# Patient Record
Sex: Male | Born: 1945 | Race: Asian | Hispanic: No | Marital: Married | Smoking: Former smoker
Health system: Southern US, Community
[De-identification: ages and names within clinical notes are randomized; demographics above are authoritative.]

## PROBLEM LIST (undated history)

## (undated) DIAGNOSIS — M069 Rheumatoid arthritis, unspecified: Secondary | ICD-10-CM

## (undated) DIAGNOSIS — I219 Acute myocardial infarction, unspecified: Secondary | ICD-10-CM

## (undated) DIAGNOSIS — H9190 Unspecified hearing loss, unspecified ear: Secondary | ICD-10-CM

## (undated) DIAGNOSIS — M5136 Other intervertebral disc degeneration, lumbar region: Secondary | ICD-10-CM

## (undated) DIAGNOSIS — M1990 Unspecified osteoarthritis, unspecified site: Secondary | ICD-10-CM

## (undated) DIAGNOSIS — J40 Bronchitis, not specified as acute or chronic: Secondary | ICD-10-CM

## (undated) DIAGNOSIS — S62524A Nondisplaced fracture of distal phalanx of right thumb, initial encounter for closed fracture: Secondary | ICD-10-CM

## (undated) DIAGNOSIS — M51369 Other intervertebral disc degeneration, lumbar region without mention of lumbar back pain or lower extremity pain: Secondary | ICD-10-CM

## (undated) DIAGNOSIS — S3992XA Unspecified injury of lower back, initial encounter: Secondary | ICD-10-CM

## (undated) DIAGNOSIS — I10 Essential (primary) hypertension: Secondary | ICD-10-CM

## (undated) DIAGNOSIS — K219 Gastro-esophageal reflux disease without esophagitis: Secondary | ICD-10-CM

## (undated) DIAGNOSIS — I2510 Atherosclerotic heart disease of native coronary artery without angina pectoris: Secondary | ICD-10-CM

## (undated) DIAGNOSIS — E7800 Pure hypercholesterolemia, unspecified: Secondary | ICD-10-CM

## (undated) DIAGNOSIS — T82898A Other specified complication of vascular prosthetic devices, implants and grafts, initial encounter: Secondary | ICD-10-CM

## (undated) DIAGNOSIS — E119 Type 2 diabetes mellitus without complications: Secondary | ICD-10-CM

## (undated) DIAGNOSIS — N400 Enlarged prostate without lower urinary tract symptoms: Secondary | ICD-10-CM

## (undated) HISTORY — PX: CARDIAC CATHETERIZATION: SHX172

## (undated) HISTORY — PX: COLON SURGERY: SHX602

---

## 2002-09-08 DIAGNOSIS — T82898A Other specified complication of vascular prosthetic devices, implants and grafts, initial encounter: Secondary | ICD-10-CM

## 2002-09-08 HISTORY — DX: Other specified complication of vascular prosthetic devices, implants and grafts, initial encounter: T82.898A

## 2009-06-08 ENCOUNTER — Ambulatory Visit: Payer: Self-pay | Admitting: Family Medicine

## 2011-12-04 ENCOUNTER — Ambulatory Visit: Payer: Self-pay | Admitting: Gastroenterology

## 2012-01-15 ENCOUNTER — Ambulatory Visit: Payer: Self-pay | Admitting: Ophthalmology

## 2012-01-15 LAB — CREATININE, SERUM
Creatinine: 1.05 mg/dL (ref 0.60–1.30)
EGFR (Non-African Amer.): >60

## 2014-11-24 DIAGNOSIS — S62524A Nondisplaced fracture of distal phalanx of right thumb, initial encounter for closed fracture: Secondary | ICD-10-CM | POA: Insufficient documentation

## 2015-01-08 DIAGNOSIS — M19041 Primary osteoarthritis, right hand: Secondary | ICD-10-CM | POA: Insufficient documentation

## 2015-02-21 DIAGNOSIS — E785 Hyperlipidemia, unspecified: Secondary | ICD-10-CM | POA: Insufficient documentation

## 2015-10-02 NOTE — Discharge Instructions (Signed)

## 2015-10-03 ENCOUNTER — Ambulatory Visit: Payer: Medicare Other | Admitting: Anesthesiology

## 2015-10-03 ENCOUNTER — Encounter
Admission: RE | Disposition: A | Discharge status: Home / Self Care | Payer: Self-pay | Source: Ambulatory Visit | Attending: Ophthalmology

## 2015-10-03 ENCOUNTER — Ambulatory Visit
Admission: RE | Admit: 2015-10-03 | Discharge: 2015-10-03 | Disposition: A | Discharge status: Home / Self Care | Payer: Medicare Other | Source: Ambulatory Visit | Attending: Ophthalmology | Admitting: Ophthalmology

## 2015-10-03 ENCOUNTER — Encounter: Payer: Self-pay | Admitting: *Deleted

## 2015-10-03 DIAGNOSIS — K219 Gastro-esophageal reflux disease without esophagitis: Secondary | ICD-10-CM | POA: Diagnosis not present

## 2015-10-03 DIAGNOSIS — I10 Essential (primary) hypertension: Secondary | ICD-10-CM | POA: Diagnosis not present

## 2015-10-03 DIAGNOSIS — Z87891 Personal history of nicotine dependence: Secondary | ICD-10-CM | POA: Diagnosis not present

## 2015-10-03 DIAGNOSIS — H2512 Age-related nuclear cataract, left eye: Secondary | ICD-10-CM | POA: Insufficient documentation

## 2015-10-03 HISTORY — DX: Other specified complication of vascular prosthetic devices, implants and grafts, initial encounter: T82.898A

## 2015-10-03 HISTORY — PX: CATARACT EXTRACTION W/PHACO: SHX586

## 2015-10-03 HISTORY — DX: Rheumatoid arthritis, unspecified: M06.9

## 2015-10-03 HISTORY — DX: Unspecified osteoarthritis, unspecified site: M19.90

## 2015-10-03 HISTORY — DX: Gastro-esophageal reflux disease without esophagitis: K21.9

## 2015-10-03 HISTORY — DX: Bronchitis, not specified as acute or chronic: J40

## 2015-10-03 HISTORY — DX: Unspecified hearing loss, unspecified ear: H91.90

## 2015-10-03 HISTORY — DX: Pure hypercholesterolemia, unspecified: E78.00

## 2015-10-03 HISTORY — DX: Essential (primary) hypertension: I10

## 2015-10-03 SURGERY — PHACOEMULSIFICATION, CATARACT, WITH IOL INSERTION
Anesthesia: General | Laterality: Left | Wound class: Clean

## 2015-10-03 MED ORDER — FENTANYL CITRATE (PF) 100 MCG/2ML IJ SOLN
25.0000 ug | INTRAMUSCULAR | Status: DC | PRN
  Administered 2015-10-03: 100 ug via INTRAVENOUS

## 2015-10-03 MED ORDER — CEFUROXIME OPHTHALMIC INJECTION 1 MG/0.1 ML
INJECTION | OPHTHALMIC | Status: DC | PRN
  Administered 2015-10-03: 0.1 mL via INTRACAMERAL

## 2015-10-03 MED ORDER — OXYCODONE HCL 5 MG PO TABS: 5.0000 mg | ORAL_TABLET | Freq: Once | ORAL | Status: DC | PRN

## 2015-10-03 MED ORDER — TIMOLOL MALEATE 0.5 % OP SOLN
OPHTHALMIC | Status: DC | PRN
  Administered 2015-10-03: 1 [drp] via OPHTHALMIC

## 2015-10-03 MED ORDER — ACETAMINOPHEN 160 MG/5ML PO SOLN: 325.0000 mg | ORAL | Status: DC | PRN

## 2015-10-03 MED ORDER — LACTATED RINGERS IV SOLN: 500.0000 mL | INTRAVENOUS | Status: DC

## 2015-10-03 MED ORDER — ACETAMINOPHEN 325 MG PO TABS: 325.0000 mg | ORAL_TABLET | ORAL | Status: DC | PRN

## 2015-10-03 MED ORDER — MIDAZOLAM HCL 2 MG/2ML IJ SOLN
INTRAMUSCULAR | Status: DC | PRN
  Administered 2015-10-03: 2 mg via INTRAVENOUS

## 2015-10-03 MED ORDER — TETRACAINE HCL 0.5 % OP SOLN
1.0000 [drp] | OPHTHALMIC | Status: DC | PRN
  Administered 2015-10-03: 1 [drp] via OPHTHALMIC

## 2015-10-03 MED ORDER — EPINEPHRINE HCL 1 MG/ML IJ SOLN
INTRAOCULAR | Status: DC | PRN
  Administered 2015-10-03: 70 mL via OPHTHALMIC

## 2015-10-03 MED ORDER — BRIMONIDINE TARTRATE 0.2 % OP SOLN
OPHTHALMIC | Status: DC | PRN
Start: 2015-10-03 — End: 2015-10-03
  Administered 2015-10-03: 1 [drp] via OPHTHALMIC

## 2015-10-03 MED ORDER — OXYCODONE HCL 5 MG/5ML PO SOLN: 5.0000 mg | Freq: Once | ORAL | Status: DC | PRN

## 2015-10-03 MED ORDER — DEXAMETHASONE SODIUM PHOSPHATE 4 MG/ML IJ SOLN: 8.0000 mg | Freq: Once | INTRAMUSCULAR | Status: DC | PRN

## 2015-10-03 MED ORDER — ARMC OPHTHALMIC DILATING GEL
1.0000 "application " | OPHTHALMIC | Status: DC | PRN
  Administered 2015-10-03: 1 via OPHTHALMIC

## 2015-10-03 MED ORDER — NA HYALUR & NA CHOND-NA HYALUR 0.4-0.35 ML IO KIT
PACK | INTRAOCULAR | Status: DC | PRN
  Administered 2015-10-03: 1 mL via INTRAOCULAR

## 2015-10-03 MED ORDER — POVIDONE-IODINE 5 % OP SOLN
1.0000 "application " | OPHTHALMIC | Status: DC | PRN
  Administered 2015-10-03: 1 via OPHTHALMIC

## 2015-10-03 SURGICAL SUPPLY — 27 items
CANNULA ANT/CHMB 27GA (MISCELLANEOUS) ×3 IMPLANT
CARTRIDGE ABBOTT (MISCELLANEOUS) ×3 IMPLANT
GLOVE SURG LX 7.5 STRW (GLOVE) ×2
GLOVE SURG LX STRL 7.5 STRW (GLOVE) ×1 IMPLANT
GLOVE SURG TRIUMPH 8.0 PF LTX (GLOVE) ×3 IMPLANT
GOWN STRL REUS W/ TWL LRG LVL3 (GOWN DISPOSABLE) ×2 IMPLANT
GOWN STRL REUS W/TWL LRG LVL3 (GOWN DISPOSABLE) ×4
LENS IOL TECNIS 17.0 (Intraocular Lens) ×3 IMPLANT
LENS IOL TECNIS MONO 1P 17.0 (Intraocular Lens) ×1 IMPLANT
MARKER SKIN SURG W/RULER VIO (MISCELLANEOUS) ×3 IMPLANT
NDL RETROBULBAR .5 NSTRL (NEEDLE) IMPLANT
NEEDLE FILTER BLUNT 18X 1/2SAF (NEEDLE) ×2
NEEDLE FILTER BLUNT 18X1 1/2 (NEEDLE) ×1 IMPLANT
PACK CATARACT BRASINGTON (MISCELLANEOUS) ×3 IMPLANT
PACK EYE AFTER SURG (MISCELLANEOUS) ×3 IMPLANT
PACK OPTHALMIC (MISCELLANEOUS) ×3 IMPLANT
RING MALYGIN 7.0 (MISCELLANEOUS) IMPLANT
SUT ETHILON 10-0 CS-B-6CS-B-6 (SUTURE)
SUT VICRYL  9 0 (SUTURE)
SUT VICRYL 9 0 (SUTURE) IMPLANT
SUTURE EHLN 10-0 CS-B-6CS-B-6 (SUTURE) IMPLANT
SYR 3ML LL SCALE MARK (SYRINGE) ×3 IMPLANT
SYR 5ML LL (SYRINGE) IMPLANT
SYR TB 1ML LUER SLIP (SYRINGE) ×3 IMPLANT
WATER STERILE IRR 250ML POUR (IV SOLUTION) ×3 IMPLANT
WATER STERILE IRR 500ML POUR (IV SOLUTION) IMPLANT
WIPE NON LINTING 3.25X3.25 (MISCELLANEOUS) ×3 IMPLANT

## 2015-10-03 NOTE — H&P (Signed)
  The History and Physical notes are on paper, have been signed, and are to be scanned. The patient remains stable and unchanged from the H&P.   Previous H&P reviewed, patient examined, and there are no changes.  Jacob Knox 10/03/2015 8:13 AM

## 2015-10-03 NOTE — Anesthesia Preprocedure Evaluation (Signed)
Anesthesia Evaluation  Patient identified by MRN, date of birth, ID band Patient awake    Reviewed: Allergy & Precautions, H&P , NPO status , Patient's Chart, lab work & pertinent test results, reviewed documented beta blocker date and time   Airway Mallampati: II  TM Distance: >3 FB Neck ROM: full    Dental no notable dental hx.    Pulmonary former smoker,    Pulmonary exam normal breath sounds clear to auscultation       Cardiovascular Exercise Tolerance: Good hypertension, On Medications  Rhythm:regular Rate:Normal     Neuro/Psych negative neurological ROS  negative psych ROS   GI/Hepatic Neg liver ROS, GERD  Medicated,  Endo/Other  negative endocrine ROS  Renal/GU negative Renal ROS  negative genitourinary   Musculoskeletal   Abdominal   Peds  Hematology   Anesthesia Other Findings   Reproductive/Obstetrics negative OB ROS                             Anesthesia Physical Anesthesia Plan  ASA: II  Anesthesia Plan: General   Post-op Pain Management:    Induction:   Airway Management Planned:   Additional Equipment:   Intra-op Plan:   Post-operative Plan:   Informed Consent: I have reviewed the patients History and Physical, chart, labs and discussed the procedure including the risks, benefits and alternatives for the proposed anesthesia with the patient or authorized representative who has indicated his/her understanding and acceptance.   Dental Advisory Given  Plan Discussed with: CRNA  Anesthesia Plan Comments:         Anesthesia Quick Evaluation

## 2015-10-03 NOTE — Anesthesia Procedure Notes (Signed)
Procedure Name: MAC Performed by: Lazette Estala Pre-anesthesia Checklist: Patient identified, Emergency Drugs available, Suction available, Timeout performed and Patient being monitored Patient Re-evaluated:Patient Re-evaluated prior to inductionOxygen Delivery Method: Nasal cannula Placement Confirmation: positive ETCO2       

## 2015-10-03 NOTE — Anesthesia Postprocedure Evaluation (Signed)
Anesthesia Post Note  Patient: Jacob Knox  Procedure(s) Performed: Procedure(s) (LRB): CATARACT EXTRACTION PHACO AND INTRAOCULAR LENS PLACEMENT (IOC) (Left)  Patient location during evaluation: PACU Anesthesia Type: MAC Level of consciousness: awake and alert Pain management: pain level controlled Vital Signs Assessment: post-procedure vital signs reviewed and stable Respiratory status: spontaneous breathing, nonlabored ventilation and respiratory function stable Cardiovascular status: blood pressure returned to baseline and stable Postop Assessment: no signs of nausea or vomiting Anesthetic complications: no    DANIEL D KOVACS

## 2015-10-03 NOTE — Op Note (Signed)
OPERATIVE NOTE  Jacob Knox [id number] 10/03/2015   PREOPERATIVE DIAGNOSIS:  Nuclear sclerotic cataract left eye. H25.12   POSTOPERATIVE DIAGNOSIS:    Nuclear sclerotic cataract left eye.     PROCEDURE:  Phacoemusification with posterior chamber intraocular lens placement of the left eye   LENS:   Implant Name Type Inv. Item Serial No. Manufacturer Lot No. LRB No. Used  LENS IMPL INTRAOC ZCB00 17.0 - [id number] Intraocular Lens LENS IMPL INTRAOC ZCB00 17.0 [id number] AMO   Left 1        ULTRASOUND TIME: 13  % of 1 minutes 4 seconds, CDE 8.3  SURGEON:  Deirdre Evener, MD   ANESTHESIA:  Topical with tetracaine drops and 2% Xylocaine jelly.   COMPLICATIONS:  None.   DESCRIPTION OF PROCEDURE:  The patient was identified in the holding room and transported to the operating room and placed in the supine position under the operating microscope.  The left eye was identified as the operative eye and it was prepped and draped in the usual sterile ophthalmic fashion.   A 1 millimeter clear-corneal paracentesis was made at the 1:30 position.  The anterior chamber was filled with Viscoat viscoelastic.  A 2.4 millimeter keratome was used to make a near-clear corneal incision at the 10:30 position.  .  A curvilinear capsulorrhexis was made with a cystotome and capsulorrhexis forceps.  Balanced salt solution was used to hydrodissect and hydrodelineate the nucleus.   Phacoemulsification was then used in stop and chop fashion to remove the lens nucleus and epinucleus.  The remaining cortex was then removed using the irrigation and aspiration handpiece. Provisc was then placed into the capsular bag to distend it for lens placement.  A lens was then injected into the capsular bag.  The remaining viscoelastic was aspirated.   Wounds were hydrated with balanced salt solution.  The anterior chamber was inflated to a physiologic pressure with balanced salt solution.  No wound leaks were noted.  Cefuroxime 0.1 ml of a /ml solution was injected into the anterior chamber for a dose of 1 mg of intracameral antibiotic at the completion of the case.   Timolol and Brimonidine drops were applied to the eye.  The patient was taken to the recovery room in stable condition without complications of anesthesia or surgery.  Trellis Guirguis 10/03/2015, 9:53 AM

## 2015-10-03 NOTE — Transfer of Care (Signed)
Immediate Anesthesia Transfer of Care Note  Patient: Jacob Knox  Procedure(s) Performed: Procedure(s): CATARACT EXTRACTION PHACO AND INTRAOCULAR LENS PLACEMENT (IOC) (Left)  Patient Location: PACU  Anesthesia Type: General  Level of Consciousness: awake, alert  and patient cooperative  Airway and Oxygen Therapy: Patient Spontanous Breathing and Patient connected to supplemental oxygen  Post-op Assessment: Post-op Vital signs reviewed, Patient's Cardiovascular Status Stable, Respiratory Function Stable, Patent Airway and No signs of Nausea or vomiting  Post-op Vital Signs: Reviewed and stable  Complications: No apparent anesthesia complications

## 2015-10-04 ENCOUNTER — Encounter: Payer: Self-pay | Admitting: Ophthalmology

## 2016-03-18 ENCOUNTER — Other Ambulatory Visit: Payer: Self-pay | Admitting: Family Medicine

## 2016-04-22 ENCOUNTER — Telehealth: Payer: Self-pay | Admitting: *Deleted

## 2016-04-22 NOTE — Telephone Encounter (Signed)
Received referral for low dose lung cancer screening CT scan. Voicemail left at phone number listed in EMR for patient to call me back to facilitate scheduling scan.  

## 2016-09-29 DIAGNOSIS — E119 Type 2 diabetes mellitus without complications: Secondary | ICD-10-CM | POA: Insufficient documentation

## 2019-06-09 ENCOUNTER — Other Ambulatory Visit: Payer: Self-pay

## 2019-06-09 ENCOUNTER — Encounter: Payer: Self-pay | Admitting: *Deleted

## 2019-06-10 ENCOUNTER — Other Ambulatory Visit
Admission: RE | Admit: 2019-06-10 | Discharge: 2019-06-10 | Disposition: A | Discharge status: Home / Self Care | Payer: Medicare Other | Source: Ambulatory Visit | Attending: Ophthalmology | Admitting: Ophthalmology

## 2019-06-10 DIAGNOSIS — Z01812 Encounter for preprocedural laboratory examination: Secondary | ICD-10-CM | POA: Diagnosis not present

## 2019-06-10 DIAGNOSIS — Z20828 Contact with and (suspected) exposure to other viral communicable diseases: Secondary | ICD-10-CM | POA: Insufficient documentation

## 2019-06-11 LAB — SARS CORONAVIRUS 2 (TAT 6-24 HRS): SARS Coronavirus 2: NEGATIVE

## 2019-06-13 NOTE — Discharge Instructions (Signed)

## 2019-06-28 ENCOUNTER — Encounter: Payer: Self-pay | Admitting: *Deleted

## 2019-06-28 ENCOUNTER — Other Ambulatory Visit: Payer: Self-pay

## 2019-07-01 ENCOUNTER — Other Ambulatory Visit
Admission: RE | Admit: 2019-07-01 | Discharge: 2019-07-01 | Disposition: A | Discharge status: Home / Self Care | Payer: Medicare Other | Source: Ambulatory Visit | Attending: Ophthalmology | Admitting: Ophthalmology

## 2019-07-01 DIAGNOSIS — Z20828 Contact with and (suspected) exposure to other viral communicable diseases: Secondary | ICD-10-CM | POA: Diagnosis not present

## 2019-07-01 DIAGNOSIS — Z01812 Encounter for preprocedural laboratory examination: Secondary | ICD-10-CM | POA: Diagnosis present

## 2019-07-01 LAB — SARS CORONAVIRUS 2 (TAT 6-24 HRS): SARS Coronavirus 2: NEGATIVE

## 2019-07-06 ENCOUNTER — Ambulatory Visit: Payer: Medicare Other | Admitting: Anesthesiology

## 2019-07-06 ENCOUNTER — Encounter
Admission: RE | Disposition: A | Discharge status: Home / Self Care | Payer: Self-pay | Source: Home / Self Care | Attending: Ophthalmology

## 2019-07-06 ENCOUNTER — Other Ambulatory Visit: Payer: Self-pay

## 2019-07-06 ENCOUNTER — Ambulatory Visit
Admission: RE | Admit: 2019-07-06 | Discharge: 2019-07-06 | Disposition: A | Discharge status: Home / Self Care | Payer: Medicare Other | Attending: Ophthalmology | Admitting: Ophthalmology

## 2019-07-06 DIAGNOSIS — H2511 Age-related nuclear cataract, right eye: Secondary | ICD-10-CM | POA: Diagnosis not present

## 2019-07-06 DIAGNOSIS — Z7982 Long term (current) use of aspirin: Secondary | ICD-10-CM | POA: Diagnosis not present

## 2019-07-06 DIAGNOSIS — Z79899 Other long term (current) drug therapy: Secondary | ICD-10-CM | POA: Diagnosis not present

## 2019-07-06 DIAGNOSIS — E1136 Type 2 diabetes mellitus with diabetic cataract: Secondary | ICD-10-CM | POA: Insufficient documentation

## 2019-07-06 DIAGNOSIS — Z955 Presence of coronary angioplasty implant and graft: Secondary | ICD-10-CM | POA: Insufficient documentation

## 2019-07-06 DIAGNOSIS — H9190 Unspecified hearing loss, unspecified ear: Secondary | ICD-10-CM | POA: Insufficient documentation

## 2019-07-06 DIAGNOSIS — Z87891 Personal history of nicotine dependence: Secondary | ICD-10-CM | POA: Diagnosis not present

## 2019-07-06 DIAGNOSIS — I10 Essential (primary) hypertension: Secondary | ICD-10-CM | POA: Insufficient documentation

## 2019-07-06 DIAGNOSIS — E7800 Pure hypercholesterolemia, unspecified: Secondary | ICD-10-CM | POA: Diagnosis not present

## 2019-07-06 HISTORY — PX: CATARACT EXTRACTION W/PHACO: SHX586

## 2019-07-06 HISTORY — DX: Type 2 diabetes mellitus without complications: E11.9

## 2019-07-06 SURGERY — PHACOEMULSIFICATION, CATARACT, WITH IOL INSERTION
Anesthesia: Monitor Anesthesia Care | Site: Eye | Laterality: Right

## 2019-07-06 MED ORDER — ONDANSETRON HCL 4 MG/2ML IJ SOLN: 4.0000 mg | Freq: Once | INTRAMUSCULAR | Status: DC | PRN

## 2019-07-06 MED ORDER — MOXIFLOXACIN HCL 0.5 % OP SOLN
1.0000 [drp] | OPHTHALMIC | Status: DC | PRN
  Administered 2019-07-06: 1 [drp] via OPHTHALMIC

## 2019-07-06 MED ORDER — MOXIFLOXACIN HCL 0.5 % OP SOLN
1.0000 [drp] | OPHTHALMIC | Status: DC | PRN
  Administered 2019-07-06 (×2): 1 [drp] via OPHTHALMIC

## 2019-07-06 MED ORDER — CEFUROXIME OPHTHALMIC INJECTION 1 MG/0.1 ML
INJECTION | OPHTHALMIC | Status: DC | PRN
  Administered 2019-07-06: 0.1 mL via INTRACAMERAL

## 2019-07-06 MED ORDER — EPINEPHRINE PF 1 MG/ML IJ SOLN
INTRAOCULAR | Status: DC | PRN
  Administered 2019-07-06: 60 mL via OPHTHALMIC

## 2019-07-06 MED ORDER — BRIMONIDINE TARTRATE-TIMOLOL 0.2-0.5 % OP SOLN
OPHTHALMIC | Status: DC | PRN
  Administered 2019-07-06: 1 [drp] via OPHTHALMIC

## 2019-07-06 MED ORDER — NA HYALUR & NA CHOND-NA HYALUR 0.4-0.35 ML IO KIT
PACK | INTRAOCULAR | Status: DC | PRN
  Administered 2019-07-06: 1 mL via INTRAOCULAR

## 2019-07-06 MED ORDER — LACTATED RINGERS IV SOLN: INTRAVENOUS | Status: DC

## 2019-07-06 MED ORDER — ARMC OPHTHALMIC DILATING DROPS
1.0000 "application " | OPHTHALMIC | Status: DC | PRN
  Administered 2019-07-06 (×3): 1 via OPHTHALMIC

## 2019-07-06 MED ORDER — LIDOCAINE HCL (PF) 2 % IJ SOLN
INTRAOCULAR | Status: DC | PRN
  Administered 2019-07-06: 1 mL

## 2019-07-06 MED ORDER — FENTANYL CITRATE (PF) 100 MCG/2ML IJ SOLN
INTRAMUSCULAR | Status: DC | PRN
  Administered 2019-07-06: 50 ug via INTRAVENOUS

## 2019-07-06 MED ORDER — TETRACAINE HCL 0.5 % OP SOLN
1.0000 [drp] | OPHTHALMIC | Status: DC | PRN
  Administered 2019-07-06 (×3): 1 [drp] via OPHTHALMIC

## 2019-07-06 MED ORDER — MIDAZOLAM HCL 2 MG/2ML IJ SOLN
INTRAMUSCULAR | Status: DC | PRN
  Administered 2019-07-06: 2 mg via INTRAVENOUS

## 2019-07-06 SURGICAL SUPPLY — 15 items
CANNULA ANT/CHMB 27GA (MISCELLANEOUS) ×3 IMPLANT
GLOVE SURG LX 7.5 STRW (GLOVE) ×2
GLOVE SURG LX STRL 7.5 STRW (GLOVE) ×1 IMPLANT
GLOVE SURG TRIUMPH 8.0 PF LTX (GLOVE) ×3 IMPLANT
GOWN STRL REUS W/ TWL LRG LVL3 (GOWN DISPOSABLE) ×2 IMPLANT
GOWN STRL REUS W/TWL LRG LVL3 (GOWN DISPOSABLE) ×4
LENS IOL TECNIS ITEC 12.5 (Intraocular Lens) ×3 IMPLANT
MARKER SKIN DUAL TIP RULER LAB (MISCELLANEOUS) ×3 IMPLANT
PACK CATARACT BRASINGTON (MISCELLANEOUS) ×3 IMPLANT
PACK EYE AFTER SURG (MISCELLANEOUS) ×3 IMPLANT
PACK OPTHALMIC (MISCELLANEOUS) ×3 IMPLANT
SYR 3ML LL SCALE MARK (SYRINGE) ×3 IMPLANT
SYR TB 1ML LUER SLIP (SYRINGE) ×3 IMPLANT
WATER STERILE IRR 500ML POUR (IV SOLUTION) ×3 IMPLANT
WIPE NON LINTING 3.25X3.25 (MISCELLANEOUS) ×3 IMPLANT

## 2019-07-06 NOTE — Op Note (Signed)
LOCATION:  [facility]   PREOPERATIVE DIAGNOSIS:    Nuclear sclerotic cataract right eye. H25.11   POSTOPERATIVE DIAGNOSIS:  Nuclear sclerotic cataract right eye.     PROCEDURE:  Phacoemusification with posterior chamber intraocular lens placement of the right eye   ULTRASOUND TIME: Procedure(s) with comments: CATARACT EXTRACTION PHACO AND INTRAOCULAR LENS PLACEMENT (IOC) RIGHT  00:45.3  19.6%  8.85 (Right) - Diabetes - diet-controlled  LENS:   Implant Name Type Inv. Item Serial No. Manufacturer Lot No. LRB No. Used Action  LENS IOL DIOP 12.5 - [id number] Intraocular Lens LENS IOL DIOP 12.5 [id number] AMO  Right 1 Implanted         SURGEON:  Wyonia Hough, MD   ANESTHESIA:  Topical with tetracaine drops and 2% Xylocaine jelly, augmented with 1% preservative-free intracameral lidocaine.    COMPLICATIONS:  None.   DESCRIPTION OF PROCEDURE:  The patient was identified in the holding room and transported to the operating room and placed in the supine position under the operating microscope.  The right eye was identified as the operative eye and it was prepped and draped in the usual sterile ophthalmic fashion.   A 1 millimeter clear-corneal paracentesis was made at the 12:00 position.  0.5 ml of preservative-free 1% lidocaine was injected into the anterior chamber. The anterior chamber was filled with Viscoat viscoelastic.  A 2.4 millimeter keratome was used to make a near-clear corneal incision at the 9:00 position.  A curvilinear capsulorrhexis was made with a cystotome and capsulorrhexis forceps.  Balanced salt solution was used to hydrodissect and hydrodelineate the nucleus.   Phacoemulsification was then used in stop and chop fashion to remove the lens nucleus and epinucleus.  The remaining cortex was then removed using the irrigation and aspiration handpiece. Provisc was then placed into the capsular bag to distend it for lens placement.  A lens was then injected  into the capsular bag.  The remaining viscoelastic was aspirated.   Wounds were hydrated with balanced salt solution.  The anterior chamber was inflated to a physiologic pressure with balanced salt solution.  No wound leaks were noted. Cefuroxime 0.1 ml of a 10mg /ml solution was injected into the anterior chamber for a dose of 1 mg of intracameral antibiotic at the completion of the case.   Timolol and Brimonidine drops were applied to the eye.  The patient was taken to the recovery room in stable condition without complications of anesthesia or surgery.   Paris Chiriboga 07/06/2019, 8:03 AM

## 2019-07-06 NOTE — H&P (Signed)

## 2019-07-06 NOTE — Anesthesia Postprocedure Evaluation (Signed)
Anesthesia Post Note  Patient: Jacob Knox  Procedure(s) Performed: CATARACT EXTRACTION PHACO AND INTRAOCULAR LENS PLACEMENT (IOC) RIGHT  00:45.3  19.6%  8.85 (Right Eye)  Patient location during evaluation: PACU Anesthesia Type: MAC Level of consciousness: awake and alert Pain management: pain level controlled Vital Signs Assessment: post-procedure vital signs reviewed and stable Respiratory status: spontaneous breathing, nonlabored ventilation, respiratory function stable and patient connected to nasal cannula oxygen Cardiovascular status: stable and blood pressure returned to baseline Postop Assessment: no apparent nausea or vomiting Anesthetic complications: no    Sinda Du

## 2019-07-06 NOTE — Anesthesia Preprocedure Evaluation (Signed)
Anesthesia Evaluation  Patient identified by MRN, date of birth, ID band Patient awake    Reviewed: Allergy & Precautions, NPO status , Patient's Chart, lab work & pertinent test results, reviewed documented beta blocker date and time   History of Anesthesia Complications Negative for: history of anesthetic complications  Airway Mallampati: I  TM Distance: >3 FB Neck ROM: Full    Dental no notable dental hx.    Pulmonary former smoker,    breath sounds clear to auscultation       Cardiovascular Exercise Tolerance: Good hypertension,  Rhythm:Regular Rate:Normal     Neuro/Psych    GI/Hepatic GERD  ,  Endo/Other  diabetes  Renal/GU      Musculoskeletal  (+) Arthritis ,   Abdominal Normal abdominal exam  (+) - obese,   Peds  Hematology   Anesthesia Other Findings   Reproductive/Obstetrics                             Anesthesia Physical Anesthesia Plan  ASA: II  Anesthesia Plan: MAC   Post-op Pain Management:    Induction: Intravenous  PONV Risk Score and Plan: 1 and Treatment may vary due to age or medical condition  Airway Management Planned: Natural Airway  Additional Equipment:   Intra-op Plan:   Post-operative Plan:   Informed Consent: I have reviewed the patients History and Physical, chart, labs and discussed the procedure including the risks, benefits and alternatives for the proposed anesthesia with the patient or authorized representative who has indicated his/her understanding and acceptance.     Dental advisory given  Plan Discussed with: CRNA and Anesthesiologist  Anesthesia Plan Comments:         Anesthesia Quick Evaluation

## 2019-07-06 NOTE — Transfer of Care (Signed)
Immediate Anesthesia Transfer of Care Note  Patient: Jacob Knox  Procedure(s) Performed: CATARACT EXTRACTION PHACO AND INTRAOCULAR LENS PLACEMENT (IOC) RIGHT  00:45.3  19.6%  8.85 (Right Eye)  Patient Location: PACU  Anesthesia Type: MAC  Level of Consciousness: awake, alert  and patient cooperative  Airway and Oxygen Therapy: Patient Spontanous Breathing and Patient connected to supplemental oxygen  Post-op Assessment: Post-op Vital signs reviewed, Patient's Cardiovascular Status Stable, Respiratory Function Stable, Patent Airway and No signs of Nausea or vomiting  Post-op Vital Signs: Reviewed and stable  Complications: No apparent anesthesia complications

## 2019-07-07 ENCOUNTER — Encounter: Payer: Self-pay | Admitting: Ophthalmology

## 2019-10-30 ENCOUNTER — Ambulatory Visit: Payer: Medicare Other | Attending: Internal Medicine

## 2019-10-30 ENCOUNTER — Other Ambulatory Visit: Payer: Self-pay

## 2019-10-30 DIAGNOSIS — Z23 Encounter for immunization: Secondary | ICD-10-CM

## 2019-10-30 NOTE — Progress Notes (Signed)
   Covid-19 Vaccination Clinic  Name:  Jacob Knox    MRN: [id number] DOB: Aug 28, 1946  10/30/2019  Jacob Knox was observed post Covid-19 immunization for 15 minutes without incidence. He was provided with Vaccine Information Sheet and instruction to access the V-Safe system.   Jacob Knox was instructed to call 911 with any severe reactions post vaccine: Marland Kitchen Difficulty breathing  . Swelling of your face and throat  . A fast heartbeat  . A bad rash all over your body  . Dizziness and weakness    Immunizations Administered    Name Date Dose VIS Date Route   Pfizer COVID-19 Vaccine 10/30/2019 12:48 PM 0.3 mL 08/19/2019 Intramuscular   Manufacturer: [employer], [employer]   Lot: [id number]   NDC: [address]-2

## 2019-11-22 ENCOUNTER — Ambulatory Visit: Payer: Medicare Other | Attending: Internal Medicine

## 2019-11-22 DIAGNOSIS — Z23 Encounter for immunization: Secondary | ICD-10-CM

## 2019-11-22 NOTE — Progress Notes (Signed)
   Covid-19 Vaccination Clinic  Name:  Amro Winebarger    MRN: [id number] DOB: 1946/06/10  11/22/2019  Mr. Baines was observed post Covid-19 immunization for 15 minutes without incident. He was provided with Vaccine Information Sheet and instruction to access the V-Safe system.   Mr. Jandreau was instructed to call 911 with any severe reactions post vaccine: Marland Kitchen Difficulty breathing  . Swelling of face and throat  . A fast heartbeat  . A bad rash all over body  . Dizziness and weakness   Immunizations Administered    Name Date Dose VIS Date Route   Pfizer COVID-19 Vaccine 11/22/2019 12:32 PM 0.3 mL 08/19/2019 Intramuscular   Manufacturer: [employer], [employer]   Lot: [id number]   NDC: [address]-8

## 2021-03-31 NOTE — Progress Notes (Signed)
04/01/2021 2:34 PM   Jacob Knox Aug 17, 1946 [id number]  Referring provider: Randa Spike, DO [phone] [facility] [address] [facility] [place],  [place] [address]  Chief Complaint  Patient presents with   Other    HPI: Jacob Daywalt is a 75 y.o. male who presents for evaluation of flank pain and hematuria.   3-4 month history of intermittent left flank pain He swims regularly and pain worse when doing flip turns Episode of gross hematuria in early July Seen at [facility] 03/19/2021 with a 3-day history of dull left lower quadrant abdominal pain which was intermittently sharp/stabbing No bothersome LUTS  No fever, chills, nausea or vomiting UA with 10-50 RBCs Symptoms felt consistent with renal colic and started on tamsulosin and tramadol No imaging performed Since his [facility] visit he continues to have intermittent left flank pain No recurrent gross hematuria Saw Dr. Cindie Laroche ~ 2010 for hematuria and states he had a negative cystoscopy.  Renal ultrasound performed 2010 was unremarkable   PMH: Past Medical History:  Diagnosis Date   Arthritis    shoulder   Bronchitis in the past   Carotid stent occlusion (HCC) 2004   Diabetes mellitus, type 2 (HCC)    diet controlled   GERD (gastroesophageal reflux disease)    HOH (hard of hearing)    Hypercholesterolemia    Hypertension    Rheumatoid arthritis [facility])     Surgical History: Past Surgical History:  Procedure Laterality Date   CARDIAC CATHETERIZATION     stent placed   CATARACT EXTRACTION W/PHACO Left 10/03/2015   Procedure: CATARACT EXTRACTION PHACO AND INTRAOCULAR LENS PLACEMENT (IOC);  Surgeon: Lockie Mola, MD;  Location: [facility];  Service: Ophthalmology;  Laterality: Left;   CATARACT EXTRACTION W/PHACO Right 07/06/2019   Procedure: CATARACT EXTRACTION PHACO AND INTRAOCULAR LENS PLACEMENT (IOC) RIGHT  00:45.3  19.6%  8.85;  Surgeon: Lockie Mola, MD;  Location: [facility];  Service: Ophthalmology;  Laterality: Right;  Diabetes - diet-controlled   COLON SURGERY      Home Medications:  Allergies as of 04/01/2021       Reactions   Plavix [clopidogrel Bisulfate] Hives        Medication List        Accurate as of April 01, 2021  2:34 PM. If you have any questions, ask your nurse or doctor.          amLODipine 5 MG tablet Commonly known as: NORVASC Take 5 mg by mouth daily.   aspirin 81 MG tablet Take 81 mg by mouth daily.   atorvastatin 40 MG tablet Commonly known as: LIPITOR Take 40 mg by mouth daily.   b complex vitamins capsule Take 1 capsule by mouth daily.   beta carotene w/minerals tablet Take 1 tablet by mouth daily.   CALCIUM 500 PO Take 1,000 mg by mouth 2 (two) times daily.   cholecalciferol 10 MCG (400 UNIT) Tabs tablet Commonly known as: VITAMIN D3 Take 1,000 Units by mouth.   doxazosin 1 MG tablet Commonly known as: CARDURA Take 1 mg by mouth daily.   finasteride 5 MG tablet Commonly known as: PROSCAR Take 5 mg by mouth daily.   Fish Oil 1200 MG Caps Take 2,400 mg by mouth 2 (two) times daily.   Garlic 1000 MG Caps Take by mouth daily.   guanFACINE 2 MG tablet Commonly known as: TENEX Take 2 mg by mouth every morning.   metFORMIN 500 MG 24 hr tablet Commonly known  as: GLUCOPHAGE-XR TAKE 1 TABLET BY MOUTH ONCE DAILY THEN AFTER A WEEK OR SO, INCREASE TO 1 TABLET TWICE DAILY   multivitamin tablet Take 1 tablet by mouth daily.        Allergies:  Allergies  Allergen Reactions   Plavix [Clopidogrel Bisulfate] Hives    Family History: No family history on file.  Social History:  reports that he quit smoking about 24 years ago. His smoking use included cigarettes. He has never used smokeless tobacco. He reports current alcohol use of about 1.0 standard drink of alcohol per week. No history on file for drug use.   Physical Exam: BP 132/69   Pulse (!) 103   Ht 5\' 9"  (1.753 m)   Wt 162  lb (73.5 kg)   BMI 23.92 kg/m   Constitutional:  Alert and oriented, No acute distress. HEENT: [place] AT, moist mucus membranes.  Trachea midline, no masses. Cardiovascular: No clubbing, cyanosis, or edema. Respiratory: Normal respiratory effort, no increased work of breathing. Neurologic: Grossly intact, no focal deficits, moving all 4 extremities. Psychiatric: Normal mood and affect.  Laboratory Data:  Urinalysis Dipstick 1+ protein/microscopy negative   Assessment & Plan:    1.  Gross hematuria Follow-up UA [facility] with persistent microhematuria UA today unremarkable AUA risk stratification: High We discussed the recommend evaluation for high risk hematuria to include CT urogram and cystoscopy CT order placed and he was scheduled for cystoscopy  He wanted to know if any evaluation could be performed today and a KUB was ordered; he will be notified with results  2.  Left flank pain Further evaluation with CTU   , MD  [facility] [address], [address] [place], [place] [phone]

## 2021-04-01 ENCOUNTER — Ambulatory Visit
Admission: RE | Admit: 2021-04-01 | Discharge: 2021-04-01 | Disposition: A | Discharge status: Home / Self Care | Payer: Medicare Other | Source: Ambulatory Visit | Attending: Urology | Admitting: Urology

## 2021-04-01 ENCOUNTER — Other Ambulatory Visit: Payer: Self-pay

## 2021-04-01 ENCOUNTER — Encounter: Payer: Self-pay | Admitting: Urology

## 2021-04-01 ENCOUNTER — Ambulatory Visit (INDEPENDENT_AMBULATORY_CARE_PROVIDER_SITE_OTHER): Payer: Medicare Other | Admitting: Urology

## 2021-04-01 VITALS — BP 132/69 | HR 103 | Ht 69.0 in | Wt 162.0 lb

## 2021-04-01 DIAGNOSIS — R310 Gross hematuria: Secondary | ICD-10-CM | POA: Diagnosis not present

## 2021-04-01 DIAGNOSIS — R109 Unspecified abdominal pain: Secondary | ICD-10-CM

## 2021-04-03 ENCOUNTER — Other Ambulatory Visit: Payer: Self-pay | Admitting: Family Medicine

## 2021-04-03 MED ORDER — TAMSULOSIN HCL 0.4 MG PO CAPS: 0.4000 mg | ORAL_CAPSULE | Freq: Every day | ORAL | 1 refills | Status: DC

## 2021-04-04 ENCOUNTER — Telehealth: Payer: Self-pay | Admitting: *Deleted

## 2021-04-04 LAB — MICROSCOPIC EXAMINATION: Bacteria, UA: NONE SEEN

## 2021-04-04 LAB — URINALYSIS, COMPLETE
Bilirubin, UA: NEGATIVE
Ketones, UA: NEGATIVE
Leukocytes,UA: NEGATIVE
Nitrite, UA: NEGATIVE
RBC, UA: NEGATIVE
Specific Gravity, UA: >1.03 — ABNORMAL HIGH (ref 1.005–1.030)
Urobilinogen, Ur: 0.2 mg/dL (ref 0.2–1.0)
pH, UA: 5.5 (ref 5.0–7.5)

## 2021-04-04 NOTE — Telephone Encounter (Signed)
-----   Message from Riki Altes, MD sent at 04/04/2021  7:35 AM EDT ----- KUB reviewed and it looks like he has a stone in the upper portion of the left ureter which is too large to pass.  Please schedule follow-up appointment next week to discuss treatment options.  He may also have a gallstone

## 2021-04-04 NOTE — Telephone Encounter (Signed)
Notified patient as instructed, patient pleased. Discussed follow-up appointments, patient agrees  

## 2021-04-10 ENCOUNTER — Encounter: Payer: Self-pay | Admitting: Urology

## 2021-04-10 ENCOUNTER — Ambulatory Visit (INDEPENDENT_AMBULATORY_CARE_PROVIDER_SITE_OTHER): Payer: Medicare Other | Admitting: Urology

## 2021-04-10 ENCOUNTER — Other Ambulatory Visit: Payer: Self-pay

## 2021-04-10 VITALS — BP 127/73 | HR 88 | Ht 69.0 in | Wt 163.0 lb

## 2021-04-10 DIAGNOSIS — N201 Calculus of ureter: Secondary | ICD-10-CM

## 2021-04-10 DIAGNOSIS — R310 Gross hematuria: Secondary | ICD-10-CM

## 2021-04-10 NOTE — H&P (View-Only) (Signed)
04/10/2021 2:19 PM   Chung-Haw Hillery Aldo November 22, 1945 [id number]  Referring provider: Dorothey Baseman, MD [phone] [address] [place],  [place] [address]  Chief Complaint  Patient presents with   Nephrolithiasis    HPI: 75 y.o. male presents for follow-up.  Initially seen 04/01/2022 for intermittent left flank pain and gross hematuria CTU initially recommended however a KUB was performed at that visit which showed a 13 mm calcification consistent with a proximal ureteral calculus.  He also had a right-sided calcification most likely secondary to cholelithiasis He returns today to discuss stone treatment options No significant problems since last visit; no recurrent hematuria   PMH: Past Medical History:  Diagnosis Date   Arthritis    shoulder   Bronchitis in the past   Carotid stent occlusion (HCC) 2004   Diabetes mellitus, type 2 (HCC)    diet controlled   GERD (gastroesophageal reflux disease)    HOH (hard of hearing)    Hypercholesterolemia    Hypertension    Rheumatoid arthritis (HCC)     Surgical History: Past Surgical History:  Procedure Laterality Date   CARDIAC CATHETERIZATION     stent placed   CATARACT EXTRACTION W/PHACO Left 10/03/2015   Procedure: CATARACT EXTRACTION PHACO AND INTRAOCULAR LENS PLACEMENT (IOC);  Surgeon: Lockie Mola, MD;  Location: [facility];  Service: Ophthalmology;  Laterality: Left;   CATARACT EXTRACTION W/PHACO Right 07/06/2019   Procedure: CATARACT EXTRACTION PHACO AND INTRAOCULAR LENS PLACEMENT (IOC) RIGHT  00:45.3  19.6%  8.85;  Surgeon: Lockie Mola, MD;  Location: [facility];  Service: Ophthalmology;  Laterality: Right;  Diabetes - diet-controlled   COLON SURGERY      Home Medications:  Allergies as of 04/10/2021       Reactions   Plavix [clopidogrel Bisulfate] Hives        Medication List        Accurate as of April 10, 2021  2:19 PM. If you have any questions, ask your nurse or doctor.           amLODipine 5 MG tablet Commonly known as: NORVASC Take 5 mg by mouth daily.   aspirin 81 MG tablet Take 81 mg by mouth daily.   atorvastatin 40 MG tablet Commonly known as: LIPITOR Take 40 mg by mouth daily.   b complex vitamins capsule Take 1 capsule by mouth daily.   beta carotene w/minerals tablet Take 1 tablet by mouth daily.   CALCIUM 500 PO Take 1,000 mg by mouth 2 (two) times daily.   cholecalciferol 10 MCG (400 UNIT) Tabs tablet Commonly known as: VITAMIN D3 Take 1,000 Units by mouth.   doxazosin 1 MG tablet Commonly known as: CARDURA Take 1 mg by mouth daily.   finasteride 5 MG tablet Commonly known as: PROSCAR Take 5 mg by mouth daily.   Fish Oil 1200 MG Caps Take 2,400 mg by mouth 2 (two) times daily.   Garlic 1000 MG Caps Take by mouth daily.   guanFACINE 2 MG tablet Commonly known as: TENEX Take 2 mg by mouth every morning.   metFORMIN 500 MG 24 hr tablet Commonly known as: GLUCOPHAGE-XR TAKE 1 TABLET BY MOUTH ONCE DAILY THEN AFTER A WEEK OR SO, INCREASE TO 1 TABLET TWICE DAILY   multivitamin tablet Take 1 tablet by mouth daily.   tamsulosin 0.4 MG Caps capsule Commonly known as: FLOMAX Take 1 capsule (0.4 mg total) by mouth daily.        Allergies:  Allergies  Allergen Reactions  Plavix [Clopidogrel Bisulfate] Hives    Family History: No family history on file.  Social History:  reports that he quit smoking about 24 years ago. His smoking use included cigarettes. He has never used smokeless tobacco. He reports current alcohol use of about 1.0 standard drink of alcohol per week. No history on file for drug use.   Physical Exam: BP 127/73   Pulse 88   Ht 5' 9" (1.753 m)   Wt 163 lb (73.9 kg)   BMI 24.07 kg/m   Constitutional:  Alert and oriented, No acute distress. HEENT: [place] AT, moist mucus membranes.  Trachea midline, no masses. Cardiovascular: No clubbing, cyanosis, or edema. Respiratory: Normal respiratory effort, no  increased work of breathing. Skin: No rashes, bruises or suspicious lesions. Neurologic: Grossly intact, no focal deficits, moving all 4 extremities. Psychiatric: Normal mood and affect.    Pertinent Imaging: Images personally reviewed and interpreted  Abdomen 1 view (KUB)  Narrative CLINICAL DATA:  Left flank pain, hematuria  EXAM: ABDOMEN - 1 VIEW  COMPARISON:  None.  FINDINGS: Normal bowel gas pattern. Approximately 1.3 cm radiopacity projects over the expected location of the proximal left ureter probable radiopacity overlying the lower right hepatic shadow may represent a gallstone. The visualized lung bases are clear. No acute osseous abnormality. Ossification adjacent to the left acetabulum, likely intramuscular or tenderness. Lower lumbar degenerative disc disease.  IMPRESSION: 1. 1.3 cm calcification in the region of the proximal left ureter/UPJ likely represents nephrolithiasis. 2. Probable cholelithiasis as well. 3. Lower lumbar degenerative disc disease.   Electronically Signed By: Heath  McCullough M.D. On: 04/03/2021 09:25   Assessment & Plan:    1.  Left proximal ureteral calculus We discussed various treatment options for urolithiasis including observation with or without medical expulsive therapy, shockwave lithotripsy (SWL), ureteroscopy and laser lithotripsy with stent placement We discussed that management is based on stone size, location, density, patient co-morbidities, and patient preference.  Stones <5mm in size have a >80% spontaneous passage rate. Data surrounding the use of tamsulosin for medical expulsive therapy is controversial, but meta analyses suggests it is most efficacious for distal stones between 5-10mm in size.  SWL has a lower stone free rate in a single procedure, but also a lower complication rate compared to ureteroscopy and avoids a stent and associated stent related symptoms. Possible complications include renal hematoma,  steinstrasse, and need for additional treatment. Ureteroscopy with laser lithotripsy and stent placement has a higher stone free rate than SWL in a single procedure, however increased complication rate including possible infection, ureteral injury, bleeding, and stent related morbidity. Common stent related symptoms include dysuria, urgency/frequency, and flank pain.  After an extensive discussion of the risks and benefits of the above treatment options, the patient would like to proceed with ureteroscopic stone removal. We discussed the low percentage of inability to remove the stone due to ureteral anatomy and inability to access the upper ureter We will also plan right retrograde pyelogram to make sure the right-sided calcification is not in the collecting system  2.  Gross hematuria Most likely secondary to above   Davin Archuletta C Zerina Hallinan, MD  [facility] [address], [address] [place],  [address] [phone]   

## 2021-04-10 NOTE — Progress Notes (Signed)
04/10/2021 2:19 PM   Jacob Knox November 22, 1945 [id number]  Referring provider: Dorothey Baseman, MD [phone] [address] [place],  [place] [address]  Chief Complaint  Patient presents with   Nephrolithiasis    HPI: 75 y.o. male presents for follow-up.  Initially seen 04/01/2022 for intermittent left flank pain and gross hematuria CTU initially recommended however a KUB was performed at that visit which showed a 13 mm calcification consistent with a proximal ureteral calculus.  He also had a right-sided calcification most likely secondary to cholelithiasis He returns today to discuss stone treatment options No significant problems since last visit; no recurrent hematuria   PMH: Past Medical History:  Diagnosis Date   Arthritis    shoulder   Bronchitis in the past   Carotid stent occlusion (HCC) 2004   Diabetes mellitus, type 2 (HCC)    diet controlled   GERD (gastroesophageal reflux disease)    HOH (hard of hearing)    Hypercholesterolemia    Hypertension    Rheumatoid arthritis (HCC)     Surgical History: Past Surgical History:  Procedure Laterality Date   CARDIAC CATHETERIZATION     stent placed   CATARACT EXTRACTION W/PHACO Left 10/03/2015   Procedure: CATARACT EXTRACTION PHACO AND INTRAOCULAR LENS PLACEMENT (IOC);  Surgeon: Lockie Mola, MD;  Location: [facility];  Service: Ophthalmology;  Laterality: Left;   CATARACT EXTRACTION W/PHACO Right 07/06/2019   Procedure: CATARACT EXTRACTION PHACO AND INTRAOCULAR LENS PLACEMENT (IOC) RIGHT  00:45.3  19.6%  8.85;  Surgeon: Lockie Mola, MD;  Location: [facility];  Service: Ophthalmology;  Laterality: Right;  Diabetes - diet-controlled   COLON SURGERY      Home Medications:  Allergies as of 04/10/2021       Reactions   Plavix [clopidogrel Bisulfate] Hives        Medication List        Accurate as of April 10, 2021  2:19 PM. If you have any questions, ask your nurse or doctor.           amLODipine 5 MG tablet Commonly known as: NORVASC Take 5 mg by mouth daily.   aspirin 81 MG tablet Take 81 mg by mouth daily.   atorvastatin 40 MG tablet Commonly known as: LIPITOR Take 40 mg by mouth daily.   b complex vitamins capsule Take 1 capsule by mouth daily.   beta carotene w/minerals tablet Take 1 tablet by mouth daily.   CALCIUM 500 PO Take 1,000 mg by mouth 2 (two) times daily.   cholecalciferol 10 MCG (400 UNIT) Tabs tablet Commonly known as: VITAMIN D3 Take 1,000 Units by mouth.   doxazosin 1 MG tablet Commonly known as: CARDURA Take 1 mg by mouth daily.   finasteride 5 MG tablet Commonly known as: PROSCAR Take 5 mg by mouth daily.   Fish Oil 1200 MG Caps Take 2,400 mg by mouth 2 (two) times daily.   Garlic 1000 MG Caps Take by mouth daily.   guanFACINE 2 MG tablet Commonly known as: TENEX Take 2 mg by mouth every morning.   metFORMIN 500 MG 24 hr tablet Commonly known as: GLUCOPHAGE-XR TAKE 1 TABLET BY MOUTH ONCE DAILY THEN AFTER A WEEK OR SO, INCREASE TO 1 TABLET TWICE DAILY   multivitamin tablet Take 1 tablet by mouth daily.   tamsulosin 0.4 MG Caps capsule Commonly known as: FLOMAX Take 1 capsule (0.4 mg total) by mouth daily.        Allergies:  Allergies  Allergen Reactions  Plavix [Clopidogrel Bisulfate] Hives    Family History: No family history on file.  Social History:  reports that he quit smoking about 24 years ago. His smoking use included cigarettes. He has never used smokeless tobacco. He reports current alcohol use of about 1.0 standard drink of alcohol per week. No history on file for drug use.   Physical Exam: BP 127/73   Pulse 88   Ht 5\' 9"  (1.753 m)   Wt 163 lb (73.9 kg)   BMI 24.07 kg/m   Constitutional:  Alert and oriented, No acute distress. HEENT: [place] AT, moist mucus membranes.  Trachea midline, no masses. Cardiovascular: No clubbing, cyanosis, or edema. Respiratory: Normal respiratory effort, no  increased work of breathing. Skin: No rashes, bruises or suspicious lesions. Neurologic: Grossly intact, no focal deficits, moving all 4 extremities. Psychiatric: Normal mood and affect.    Pertinent Imaging: Images personally reviewed and interpreted  Abdomen 1 view (KUB)  Narrative CLINICAL DATA:  Left flank pain, hematuria  EXAM: ABDOMEN - 1 VIEW  COMPARISON:  None.  FINDINGS: Normal bowel gas pattern. Approximately 1.3 cm radiopacity projects over the expected location of the proximal left ureter probable radiopacity overlying the lower right hepatic shadow may represent a gallstone. The visualized lung bases are clear. No acute osseous abnormality. Ossification adjacent to the left acetabulum, likely intramuscular or tenderness. Lower lumbar degenerative disc disease.  IMPRESSION: 1. 1.3 cm calcification in the region of the proximal left ureter/UPJ likely represents nephrolithiasis. 2. Probable cholelithiasis as well. 3. Lower lumbar degenerative disc disease.   Electronically Signed By: M.D. On: 04/03/2021 09:25   Assessment & Plan:    1.  Left proximal ureteral calculus We discussed various treatment options for urolithiasis including observation with or without medical expulsive therapy, shockwave lithotripsy (SWL), ureteroscopy and laser lithotripsy with stent placement We discussed that management is based on stone size, location, density, patient co-morbidities, and patient preference.  Stones <66mm in size have a >80% spontaneous passage rate. Data surrounding the use of tamsulosin for medical expulsive therapy is controversial, but meta analyses suggests it is most efficacious for distal stones between 5-62mm in size.  SWL has a lower stone free rate in a single procedure, but also a lower complication rate compared to ureteroscopy and avoids a stent and associated stent related symptoms. Possible complications include renal hematoma,  steinstrasse, and need for additional treatment. Ureteroscopy with laser lithotripsy and stent placement has a higher stone free rate than SWL in a single procedure, however increased complication rate including possible infection, ureteral injury, bleeding, and stent related morbidity. Common stent related symptoms include dysuria, urgency/frequency, and flank pain.  After an extensive discussion of the risks and benefits of the above treatment options, the patient would like to proceed with ureteroscopic stone removal. We discussed the low percentage of inability to remove the stone due to ureteral anatomy and inability to access the upper ureter We will also plan right retrograde pyelogram to make sure the right-sided calcification is not in the collecting system  2.  Gross hematuria Most likely secondary to above   9m, MD  [facility] [address] [place], [place] [phone]

## 2021-04-11 ENCOUNTER — Encounter: Payer: Self-pay | Admitting: Urology

## 2021-04-11 ENCOUNTER — Other Ambulatory Visit: Payer: Self-pay

## 2021-04-11 ENCOUNTER — Ambulatory Visit (INDEPENDENT_AMBULATORY_CARE_PROVIDER_SITE_OTHER): Payer: Medicare Other | Admitting: Physician Assistant

## 2021-04-11 ENCOUNTER — Ambulatory Visit: Payer: Medicare Other | Admitting: Anesthesiology

## 2021-04-11 ENCOUNTER — Ambulatory Visit: Payer: Medicare Other

## 2021-04-11 ENCOUNTER — Encounter: Payer: Self-pay | Admitting: Physician Assistant

## 2021-04-11 ENCOUNTER — Ambulatory Visit
Admission: RE | Admit: 2021-04-11 | Discharge: 2021-04-11 | Disposition: A | Discharge status: Home / Self Care | Payer: Medicare Other | Attending: Urology | Admitting: Urology

## 2021-04-11 ENCOUNTER — Encounter
Admission: RE | Disposition: A | Discharge status: Home / Self Care | Payer: Self-pay | Source: Home / Self Care | Attending: Urology

## 2021-04-11 VITALS — BP 162/77 | HR 88 | Temp 98.9°F | Ht 69.0 in | Wt 162.0 lb

## 2021-04-11 DIAGNOSIS — Z888 Allergy status to other drugs, medicaments and biological substances status: Secondary | ICD-10-CM | POA: Insufficient documentation

## 2021-04-11 DIAGNOSIS — N201 Calculus of ureter: Secondary | ICD-10-CM | POA: Diagnosis present

## 2021-04-11 DIAGNOSIS — E119 Type 2 diabetes mellitus without complications: Secondary | ICD-10-CM | POA: Insufficient documentation

## 2021-04-11 DIAGNOSIS — I10 Essential (primary) hypertension: Secondary | ICD-10-CM | POA: Insufficient documentation

## 2021-04-11 DIAGNOSIS — Z955 Presence of coronary angioplasty implant and graft: Secondary | ICD-10-CM | POA: Diagnosis not present

## 2021-04-11 DIAGNOSIS — N132 Hydronephrosis with renal and ureteral calculous obstruction: Secondary | ICD-10-CM | POA: Diagnosis not present

## 2021-04-11 DIAGNOSIS — Z79899 Other long term (current) drug therapy: Secondary | ICD-10-CM | POA: Insufficient documentation

## 2021-04-11 DIAGNOSIS — Z7984 Long term (current) use of oral hypoglycemic drugs: Secondary | ICD-10-CM | POA: Diagnosis not present

## 2021-04-11 DIAGNOSIS — Z7982 Long term (current) use of aspirin: Secondary | ICD-10-CM | POA: Diagnosis not present

## 2021-04-11 DIAGNOSIS — E7800 Pure hypercholesterolemia, unspecified: Secondary | ICD-10-CM | POA: Insufficient documentation

## 2021-04-11 DIAGNOSIS — N390 Urinary tract infection, site not specified: Secondary | ICD-10-CM

## 2021-04-11 HISTORY — PX: CYSTOSCOPY/URETEROSCOPY/HOLMIUM LASER/STENT PLACEMENT: SHX6546

## 2021-04-11 LAB — GLUCOSE, CAPILLARY
Glucose-Capillary: 117 mg/dL — ABNORMAL HIGH (ref 70–99)
Glucose-Capillary: 126 mg/dL — ABNORMAL HIGH (ref 70–99)

## 2021-04-11 SURGERY — CYSTOSCOPY/URETEROSCOPY/HOLMIUM LASER/STENT PLACEMENT
Anesthesia: General | Site: Ureter | Laterality: Left

## 2021-04-11 MED ORDER — CEFTRIAXONE SODIUM 1 G IJ SOLR
1.0000 g | Freq: Once | INTRAMUSCULAR | Status: AC
  Administered 2021-04-11: 1 g via INTRAMUSCULAR

## 2021-04-11 MED ORDER — CHLORHEXIDINE GLUCONATE 0.12 % MT SOLN
OROMUCOSAL | Status: AC
  Filled 2021-04-11: qty 15

## 2021-04-11 MED ORDER — EPHEDRINE 5 MG/ML INJ
INTRAVENOUS | Status: AC
  Filled 2021-04-11: qty 5

## 2021-04-11 MED ORDER — CEFAZOLIN SODIUM-DEXTROSE 2-4 GM/100ML-% IV SOLN
INTRAVENOUS | Status: AC
  Filled 2021-04-11: qty 100

## 2021-04-11 MED ORDER — EPHEDRINE SULFATE 50 MG/ML IJ SOLN
INTRAMUSCULAR | Status: DC | PRN
Start: 2021-04-11 — End: 2021-04-11
  Administered 2021-04-11: 10 mg via INTRAVENOUS

## 2021-04-11 MED ORDER — LACTATED RINGERS IV SOLN: INTRAVENOUS | Status: DC

## 2021-04-11 MED ORDER — CHLORHEXIDINE GLUCONATE 0.12 % MT SOLN: 15.0000 mL | Freq: Once | OROMUCOSAL | Status: DC

## 2021-04-11 MED ORDER — LIDOCAINE HCL (CARDIAC) PF 100 MG/5ML IV SOSY
PREFILLED_SYRINGE | INTRAVENOUS | Status: DC | PRN
  Administered 2021-04-11: 100 mg via INTRAVENOUS

## 2021-04-11 MED ORDER — FENTANYL CITRATE (PF) 100 MCG/2ML IJ SOLN
INTRAMUSCULAR | Status: AC
  Filled 2021-04-11: qty 2

## 2021-04-11 MED ORDER — PROPOFOL 10 MG/ML IV BOLUS
INTRAVENOUS | Status: DC | PRN
  Administered 2021-04-11: 150 mg via INTRAVENOUS

## 2021-04-11 MED ORDER — OXYBUTYNIN CHLORIDE 5 MG PO TABS: ORAL_TABLET | ORAL | 0 refills | Status: DC

## 2021-04-11 MED ORDER — PROPOFOL 10 MG/ML IV BOLUS
INTRAVENOUS | Status: AC
  Filled 2021-04-11: qty 20

## 2021-04-11 MED ORDER — FENTANYL CITRATE (PF) 100 MCG/2ML IJ SOLN: 25.0000 ug | INTRAMUSCULAR | Status: DC | PRN

## 2021-04-11 MED ORDER — FENTANYL CITRATE (PF) 100 MCG/2ML IJ SOLN
INTRAMUSCULAR | Status: DC | PRN
  Administered 2021-04-11: 25 ug via INTRAVENOUS

## 2021-04-11 MED ORDER — ORAL CARE MOUTH RINSE: 15.0000 mL | Freq: Once | OROMUCOSAL | Status: DC

## 2021-04-11 MED ORDER — ONDANSETRON HCL 4 MG/2ML IJ SOLN
INTRAMUSCULAR | Status: DC | PRN
Start: 2021-04-11 — End: 2021-04-11
  Administered 2021-04-11: 4 mg via INTRAVENOUS

## 2021-04-11 MED ORDER — SULFAMETHOXAZOLE-TRIMETHOPRIM 800-160 MG PO TABS: 1.0000 | ORAL_TABLET | Freq: Two times a day (BID) | ORAL | 0 refills | Status: AC

## 2021-04-11 MED ORDER — STERILE WATER FOR IRRIGATION IR SOLN
Status: DC | PRN
  Administered 2021-04-11: 10 mL

## 2021-04-11 MED ORDER — SODIUM CHLORIDE 0.9 % IR SOLN
Status: DC | PRN
  Administered 2021-04-11: 1500 mL

## 2021-04-11 MED ORDER — DEXAMETHASONE SODIUM PHOSPHATE 10 MG/ML IJ SOLN
INTRAMUSCULAR | Status: DC | PRN
Start: 2021-04-11 — End: 2021-04-11
  Administered 2021-04-11: 5 mg via INTRAVENOUS

## 2021-04-11 MED ORDER — ONDANSETRON HCL 4 MG/2ML IJ SOLN: 4.0000 mg | Freq: Once | INTRAMUSCULAR | Status: DC | PRN

## 2021-04-11 SURGICAL SUPPLY — 30 items
BAG DRAIN CYSTO-URO LG1000N (MISCELLANEOUS) ×3 IMPLANT
BASKET ZERO TIP 1.9FR (BASKET) IMPLANT
BRUSH SCRUB EZ 1% IODOPHOR (MISCELLANEOUS) ×3 IMPLANT
BSKT STON RTRVL ZERO TP 1.9FR (BASKET)
CATH URET FLEX-TIP 2 LUMEN 10F (CATHETERS) IMPLANT
CATH URETL OPEN 5X70 (CATHETERS) ×3 IMPLANT
CNTNR SPEC 2.5X3XGRAD LEK (MISCELLANEOUS) ×2
CONT SPEC 4OZ STER OR WHT (MISCELLANEOUS) ×1
CONT SPEC 4OZ STRL OR WHT (MISCELLANEOUS) ×2
CONTAINER SPEC 2.5X3XGRAD LEK (MISCELLANEOUS) ×2 IMPLANT
DRAPE UTILITY 15X26 TOWEL STRL (DRAPES) ×3 IMPLANT
GAUZE 4X4 16PLY ~~LOC~~+RFID DBL (SPONGE) ×3 IMPLANT
GLOVE SURG UNDER POLY LF SZ7.5 (GLOVE) ×3 IMPLANT
GOWN STRL REUS W/ TWL LRG LVL3 (GOWN DISPOSABLE) ×2 IMPLANT
GOWN STRL REUS W/ TWL XL LVL3 (GOWN DISPOSABLE) ×2 IMPLANT
GOWN STRL REUS W/TWL LRG LVL3 (GOWN DISPOSABLE) ×3
GOWN STRL REUS W/TWL XL LVL3 (GOWN DISPOSABLE) ×3
GUIDEWIRE STR DUAL SENSOR (WIRE) ×3 IMPLANT
INFUSOR MANOMETER BAG 3000ML (MISCELLANEOUS) ×3 IMPLANT
IV NS IRRIG 3000ML ARTHROMATIC (IV SOLUTION) ×3 IMPLANT
KIT TURNOVER CYSTO (KITS) ×3 IMPLANT
PACK CYSTO AR (MISCELLANEOUS) ×3 IMPLANT
SET CYSTO W/LG BORE CLAMP LF (SET/KITS/TRAYS/PACK) ×3 IMPLANT
SHEATH URETERAL 12FRX35CM (MISCELLANEOUS) IMPLANT
STENT URET 6FRX24 CONTOUR (STENTS) ×3 IMPLANT
STENT URET 6FRX26 CONTOUR (STENTS) IMPLANT
SURGILUBE 2OZ TUBE FLIPTOP (MISCELLANEOUS) ×3 IMPLANT
TRACTIP FLEXIVA PULSE ID 200 (Laser) IMPLANT
VALVE UROSEAL ADJ ENDO (VALVE) IMPLANT
WATER STERILE IRR 1000ML POUR (IV SOLUTION) ×3 IMPLANT

## 2021-04-11 NOTE — Anesthesia Procedure Notes (Signed)
Procedure Name: LMA Insertion Date/Time: 04/11/2021 6:02 PM Performed by: Elmarie Mainland, CRNA Pre-anesthesia Checklist: Patient identified, Emergency Drugs available, Suction available and Patient being monitored Patient Re-evaluated:Patient Re-evaluated prior to induction Oxygen Delivery Method: Circle system utilized Preoxygenation: Pre-oxygenation with 100% oxygen Induction Type: IV induction Ventilation: Mask ventilation without difficulty LMA: LMA inserted LMA Size: 4.5 Number of attempts: 1 Placement Confirmation: positive ETCO2 and breath sounds checked- equal and bilateral Tube secured with: Tape Dental Injury: Teeth and Oropharynx as per pre-operative assessment

## 2021-04-11 NOTE — Discharge Instructions (Addendum)
DISCHARGE INSTRUCTIONS FOR KIDNEY STONE/URETERAL STENT   MEDICATIONS:  1. Resume all your other meds from home.  2.  AZO (over-the-counter) can help with the burning/stinging when you urinate. 3.  Oxybutynin is for bladder/stent irritation, Rx was sent to your pharmacy. 4.  An antibiotic prescription was sent to your pharmacy  ACTIVITY:  1. May resume regular activities in 24 hours. 2. No driving while on narcotic pain medications  3. Drink plenty of water  4. Continue to walk at home - you can still get blood clots when you are at home, so keep active, but don't over do it.  5. May return to work/school tomorrow or when you feel ready    SIGNS/SYMPTOMS TO CALL:  Common postoperative symptoms include urinary frequency, urgency, bladder spasm and blood in the urine  Please call us if you have a fever greater than 101.5, uncontrolled nausea/vomiting, uncontrolled pain, dizziness, unable to urinate, excessively bloody urine, chest pain, shortness of breath, leg swelling, leg pain, or any other concerns or questions.   You can reach [place] at [phone].   FOLLOW-UP:  1. You we will be contacted for a surgery date for stone removal   AMBULATORY SURGERY  DISCHARGE INSTRUCTIONS   The drugs that you were given will stay in your system until tomorrow so for the next 24 hours you should not:  Drive an automobile Make any legal decisions Drink any alcoholic beverage   You may resume regular meals tomorrow.  Today it is better to start with liquids and gradually work up to solid foods.  You may eat anything you prefer, but it is better to start with liquids, then soup and crackers, and gradually work up to solid foods.   Please notify your doctor immediately if you have any unusual bleeding, trouble breathing, redness and pain at the surgery site, drainage, fever, or pain not relieved by medication.    Your post-operative visit with Dr.                                       is:  Date:                        Time:    Please call to schedule your post-operative visit.  Additional Instructions:

## 2021-04-11 NOTE — Transfer of Care (Signed)
Immediate Anesthesia Transfer of Care Note  Patient: Jacob Knox  Procedure(s) Performed: Cystos (Left: Ureter)  Patient Location: PACU  Anesthesia Type:General  Level of Consciousness: drowsy  Airway & Oxygen Therapy: Patient Spontanous Breathing and Patient connected to face mask oxygen  Post-op Assessment: Report given to RN and Post -op Vital signs reviewed and stable  Post vital signs: Reviewed and stable  Last Vitals:  Vitals Value Taken Time  BP 116/65 04/11/21 1838  Temp 36.1 C 04/11/21 1838  Pulse 60 04/11/21 1841  Resp 12 04/11/21 1841  SpO2 99 % 04/11/21 1841  Vitals shown include unvalidated device data.  Last Pain:  Vitals:   04/11/21 1838  TempSrc:   PainSc: Asleep         Complications: No notable events documented.

## 2021-04-11 NOTE — H&P (View-Only) (Signed)
04/11/2021 1:35 PM   Jacob Knox 07-31-1946 [id number]  CC: Chief Complaint  Patient presents with   Nephrolithiasis   HPI: Jacob Knox is a 75 y.o. male with a known 13 mm proximal left ureteral stone planning to undergo ureteroscopy with Dr. Lonna Cobb who presents today for evaluation of fever.   Today he reports waking up this morning with fever and chills, T-max 100.5 F.  He has not taken any medication for this and feels his fever has since subsided.  Notably, he reports having received his most recent COVID booster 5 to 7 days ago.  He has been n.p.o. since last night.  In-office UA today positive for 3+ blood and 3+ leukocyte esterase; urine microscopy with 11-30 WBCs/HPF, >30 RBCs/HPF, granular and hyaline casts, amorphous crystals, and moderate bacteria.  PMH: Past Medical History:  Diagnosis Date   Arthritis    shoulder   Bronchitis in the past   Carotid stent occlusion (HCC) 2004   Diabetes mellitus, type 2 (HCC)    diet controlled   GERD (gastroesophageal reflux disease)    HOH (hard of hearing)    Hypercholesterolemia    Hypertension    Rheumatoid arthritis [facility])     Surgical History: Past Surgical History:  Procedure Laterality Date   CARDIAC CATHETERIZATION     stent placed   CATARACT EXTRACTION W/PHACO Left 10/03/2015   Procedure: CATARACT EXTRACTION PHACO AND INTRAOCULAR LENS PLACEMENT (IOC);  Surgeon: Lockie Mola, MD;  Location: [facility];  Service: Ophthalmology;  Laterality: Left;   CATARACT EXTRACTION W/PHACO Right 07/06/2019   Procedure: CATARACT EXTRACTION PHACO AND INTRAOCULAR LENS PLACEMENT (IOC) RIGHT  00:45.3  19.6%  8.85;  Surgeon: Lockie Mola, MD;  [facility]: [facility];  Service: Ophthalmology;  Laterality: Right;  Diabetes - diet-controlled   COLON SURGERY      Home Medications:  Allergies as of 04/11/2021       Reactions   Kiwi Extract Itching   Plavix [clopidogrel Bisulfate] Hives         Medication List        Accurate as of April 11, 2021  1:35 PM. If you have any questions, ask your nurse or doctor.          amLODipine 5 MG tablet Commonly known as: NORVASC Take 5 mg by mouth daily.   ascorbic acid 1000 MG tablet Commonly known as: VITAMIN C Take by mouth.   aspirin 81 MG tablet Take 81 mg by mouth daily.   atorvastatin 40 MG tablet Commonly known as: LIPITOR Take 40 mg by mouth daily.   b complex vitamins capsule Take 1 capsule by mouth daily.   beta carotene w/minerals tablet Take 1 tablet by mouth daily.   CALCIUM 500 PO Take 1,000 mg by mouth 2 (two) times daily.   cholecalciferol 10 MCG (400 UNIT) Tabs tablet Commonly known as: VITAMIN D3 Take 1,000 Units by mouth.   doxazosin 1 MG tablet Commonly known as: CARDURA Take 1 mg by mouth daily.   finasteride 5 MG tablet Commonly known as: PROSCAR Take 5 mg by mouth daily.   Fish Oil 1200 MG Caps Take 2,400 mg by mouth 2 (two) times daily.   Garlic 1000 MG Caps Take by mouth daily.   guanFACINE 2 MG tablet Commonly known as: TENEX Take 2 mg by mouth every morning.   metFORMIN 500 MG 24 hr tablet Commonly known as: GLUCOPHAGE-XR TAKE 1 TABLET BY MOUTH ONCE DAILY THEN AFTER A WEEK OR SO,  INCREASE TO 1 TABLET TWICE DAILY   multivitamin tablet Take 1 tablet by mouth daily.   tamsulosin 0.4 MG Caps capsule Commonly known as: FLOMAX Take 1 capsule (0.4 mg total) by mouth daily.        Allergies:  Allergies  Allergen Reactions   Kiwi Extract Itching   Plavix [Clopidogrel Bisulfate] Hives    Family History: No family history on file.  Social History:   reports that he quit smoking about 24 years ago. His smoking use included cigarettes. He has never used smokeless tobacco. He reports current alcohol use of about 1.0 standard drink of alcohol per week. No history on file for drug use.  Physical Exam: BP (!) 162/77   Pulse 88   Temp 98.9 F (37.2 C) (Oral)   Ht 5\' 9"   (1.753 m)   Wt 162 lb (73.5 kg)   BMI 23.92 kg/m   Constitutional:  Alert and oriented, no acute distress, nontoxic appearing HEENT: [place], AT Cardiovascular: No clubbing, cyanosis, or edema Respiratory: Normal respiratory effort, no increased work of breathing Skin: No rashes, bruises or suspicious lesions Neurologic: Grossly intact, no focal deficits, moving all 4 extremities Psychiatric: Normal mood and affect  Laboratory Data: Results for orders placed or performed in visit on 04/11/21  Microscopic Examination   Urine  Result Value Ref Range   WBC, UA 11-30 (A) 0 - 5 /hpf   RBC >30 (A) 0 - 2 /hpf   Epithelial Cells (non renal) 0-10 0 - 10 /hpf   Renal Epithel, UA 0-10 (A) None seen /hpf   Casts Present (A) None seen /lpf   Cast Type Granular casts (A) N/A   Crystals Present (A) N/A   Crystal Type Amorphous Sediment N/A   Bacteria, UA Moderate (A) None seen/Few  Urinalysis, Complete  Result Value Ref Range   Specific Gravity, UA 1.010 1.005 - 1.030   pH, UA 6.0 5.0 - 7.5   Color, UA Yellow Yellow   Appearance Ur Cloudy (A) Clear   Leukocytes,UA 3+ (A) Negative   Protein,UA Negative Negative/Trace   Glucose, UA Negative Negative   Ketones, UA Negative Negative   RBC, UA 3+ (A) Negative   Bilirubin, UA Negative Negative   Urobilinogen, Ur 0.2 0.2 - 1.0 mg/dL   Nitrite, UA Negative Negative   Microscopic Examination See below:    Assessment & Plan:   1. Left ureteral stone Associated with fever this morning that spontaneously resolved.  He is well-appearing in clinic today and VSS, however given the size of his known proximal left ureteral stone which puts him at risk of high-grade urinary obstruction as well as grossly infected appearing urine today, I recommended that he proceed to the OR this afternoon for urgent left ureteral stent placement for urinary decompression and source control of urinary infection.  We discussed that the stent will allow 06/11/21 to drain any  infected urine from behind the obstructing stone and that he will require a total of 14 days of culture appropriate antibiotics to clear his urinary infection.  I explained that he will require follow-up ureteroscopy with laser lithotripsy and stent exchange with Dr. [place] in 2 to 3 weeks to definitively treat the stone, which we will be leaving in place today.  Patient expressed understanding.  Additionally, we discussed common stent symptoms today including flank pain, bladder pain, gross hematuria, dysuria, urgency, and frequency.  Patient is in agreement with the plan to proceed to the OR this afternoon.  I counseled  him to remain n.p.o. in advance of procedure and we administered 1 dose of IM Rocephin in clinic today.  UA sent for culture. - Urinalysis, Complete - CULTURE, URINE COMPREHENSIVE - cefTRIAXone (ROCEPHIN) injection 1 g  Return for this afternoon for left ureteral stent placement with Dr. Lonna Cobb.  Carman Ching, PA-C  [facility] [address] [place], [place] [address] [phone]

## 2021-04-11 NOTE — Progress Notes (Signed)
04/11/2021 1:35 PM   Jacob Knox 07-31-1946 [id number]  CC: Chief Complaint  Patient presents with   Nephrolithiasis   HPI: Jacob Centrella is a 75 y.o. male with a known 13 mm proximal left ureteral stone planning to undergo ureteroscopy with Dr. Lonna Cobb who presents today for evaluation of fever.   Today he reports waking up this morning with fever and chills, T-max 100.5 F.  He has not taken any medication for this and feels his fever has since subsided.  Notably, he reports having received his most recent COVID booster 5 to 7 days ago.  He has been n.p.o. since last night.  In-office UA today positive for 3+ blood and 3+ leukocyte esterase; urine microscopy with 11-30 WBCs/HPF, >30 RBCs/HPF, granular and hyaline casts, amorphous crystals, and moderate bacteria.  PMH: Past Medical History:  Diagnosis Date   Arthritis    shoulder   Bronchitis in the past   Carotid stent occlusion (HCC) 2004   Diabetes mellitus, type 2 (HCC)    diet controlled   GERD (gastroesophageal reflux disease)    HOH (hard of hearing)    Hypercholesterolemia    Hypertension    Rheumatoid arthritis [facility])     Surgical History: Past Surgical History:  Procedure Laterality Date   CARDIAC CATHETERIZATION     stent placed   CATARACT EXTRACTION W/PHACO Left 10/03/2015   Procedure: CATARACT EXTRACTION PHACO AND INTRAOCULAR LENS PLACEMENT (IOC);  Surgeon: Lockie Mola, MD;  Location: [facility];  Service: Ophthalmology;  Laterality: Left;   CATARACT EXTRACTION W/PHACO Right 07/06/2019   Procedure: CATARACT EXTRACTION PHACO AND INTRAOCULAR LENS PLACEMENT (IOC) RIGHT  00:45.3  19.6%  8.85;  Surgeon: Lockie Mola, MD;  Location: [facility];  Service: Ophthalmology;  Laterality: Right;  Diabetes - diet-controlled   COLON SURGERY      Home Medications:  Allergies as of 04/11/2021       Reactions   Kiwi Extract Itching   Plavix [clopidogrel Bisulfate] Hives         Medication List        Accurate as of April 11, 2021  1:35 PM. If you have any questions, ask your nurse or doctor.          amLODipine 5 MG tablet Commonly known as: NORVASC Take 5 mg by mouth daily.   ascorbic acid 1000 MG tablet Commonly known as: VITAMIN C Take by mouth.   aspirin 81 MG tablet Take 81 mg by mouth daily.   atorvastatin 40 MG tablet Commonly known as: LIPITOR Take 40 mg by mouth daily.   b complex vitamins capsule Take 1 capsule by mouth daily.   beta carotene w/minerals tablet Take 1 tablet by mouth daily.   CALCIUM 500 PO Take 1,000 mg by mouth 2 (two) times daily.   cholecalciferol 10 MCG (400 UNIT) Tabs tablet Commonly known as: VITAMIN D3 Take 1,000 Units by mouth.   doxazosin 1 MG tablet Commonly known as: CARDURA Take 1 mg by mouth daily.   finasteride 5 MG tablet Commonly known as: PROSCAR Take 5 mg by mouth daily.   Fish Oil 1200 MG Caps Take 2,400 mg by mouth 2 (two) times daily.   Garlic 1000 MG Caps Take by mouth daily.   guanFACINE 2 MG tablet Commonly known as: TENEX Take 2 mg by mouth every morning.   metFORMIN 500 MG 24 hr tablet Commonly known as: GLUCOPHAGE-XR TAKE 1 TABLET BY MOUTH ONCE DAILY THEN AFTER A WEEK OR SO,  INCREASE TO 1 TABLET TWICE DAILY   multivitamin tablet Take 1 tablet by mouth daily.   tamsulosin 0.4 MG Caps capsule Commonly known as: FLOMAX Take 1 capsule (0.4 mg total) by mouth daily.        Allergies:  Allergies  Allergen Reactions   Kiwi Extract Itching   Plavix [Clopidogrel Bisulfate] Hives    Family History: No family history on file.  Social History:   reports that he quit smoking about 24 years ago. His smoking use included cigarettes. He has never used smokeless tobacco. He reports current alcohol use of about 1.0 standard drink of alcohol per week. No history on file for drug use.  Physical Exam: BP (!) 162/77   Pulse 88   Temp 98.9 F (37.2 C) (Oral)   Ht 5\' 9"   (1.753 m)   Wt 162 lb (73.5 kg)   BMI 23.92 kg/m   Constitutional:  Alert and oriented, no acute distress, nontoxic appearing HEENT: [place], AT Cardiovascular: No clubbing, cyanosis, or edema Respiratory: Normal respiratory effort, no increased work of breathing Skin: No rashes, bruises or suspicious lesions Neurologic: Grossly intact, no focal deficits, moving all 4 extremities Psychiatric: Normal mood and affect  Laboratory Data: Results for orders placed or performed in visit on 04/11/21  Microscopic Examination   Urine  Result Value Ref Range   WBC, UA 11-30 (A) 0 - 5 /hpf   RBC >30 (A) 0 - 2 /hpf   Epithelial Cells (non renal) 0-10 0 - 10 /hpf   Renal Epithel, UA 0-10 (A) None seen /hpf   Casts Present (A) None seen /lpf   Cast Type Granular casts (A) N/A   Crystals Present (A) N/A   Crystal Type Amorphous Sediment N/A   Bacteria, UA Moderate (A) None seen/Few  Urinalysis, Complete  Result Value Ref Range   Specific Gravity, UA 1.010 1.005 - 1.030   pH, UA 6.0 5.0 - 7.5   Color, UA Yellow Yellow   Appearance Ur Cloudy (A) Clear   Leukocytes,UA 3+ (A) Negative   Protein,UA Negative Negative/Trace   Glucose, UA Negative Negative   Ketones, UA Negative Negative   RBC, UA 3+ (A) Negative   Bilirubin, UA Negative Negative   Urobilinogen, Ur 0.2 0.2 - 1.0 mg/dL   Nitrite, UA Negative Negative   Microscopic Examination See below:    Assessment & Plan:   1. Left ureteral stone Associated with fever this morning that spontaneously resolved.  He is well-appearing in clinic today and VSS, however given the size of his known proximal left ureteral stone which puts him at risk of high-grade urinary obstruction as well as grossly infected appearing urine today, I recommended that he proceed to the OR this afternoon for urgent left ureteral stent placement for urinary decompression and source control of urinary infection.  We discussed that the stent will allow 06/11/21 to drain any  infected urine from behind the obstructing stone and that he will require a total of 14 days of culture appropriate antibiotics to clear his urinary infection.  I explained that he will require follow-up ureteroscopy with laser lithotripsy and stent exchange with Dr. [place] in 2 to 3 weeks to definitively treat the stone, which we will be leaving in place today.  Patient expressed understanding.  Additionally, we discussed common stent symptoms today including flank pain, bladder pain, gross hematuria, dysuria, urgency, and frequency.  Patient is in agreement with the plan to proceed to the OR this afternoon.  I counseled  him to remain n.p.o. in advance of procedure and we administered 1 dose of IM Rocephin in clinic today.  UA sent for culture. - Urinalysis, Complete - CULTURE, URINE COMPREHENSIVE - cefTRIAXone (ROCEPHIN) injection 1 g  Return for this afternoon for left ureteral stent placement with Dr. Lonna Cobb.  Carman Ching, PA-C  [facility] [address] [place], [place] [address] [phone]

## 2021-04-11 NOTE — Interval H&P Note (Signed)
History and Physical Interval Note: Refer to Jacob Knox's note today.  He developed fever 100.5 degrees last night.  Urinalysis today with significant pyuria.  Recommend cystoscopy with placement left ureteral stent.  The procedure was discussed in detail and it was stressed no attendance will be made to remove his stone.  We will proceed with definitive stone treatment after his infection has been treated.  All questions were answered and he desires to proceed.  04/11/2021 5:34 PM  Jacob Knox  has presented today for surgery, with the diagnosis of Left UPJ Stone, febrile UTI.  The various methods of treatment have been discussed with the patient and family. After consideration of risks, benefits and other options for treatment, the patient has consented to  Procedure(s): CYSTOSCOPY/URETEROSCOPY/HOLMIUM LASER/STENT PLACEMENT (Left) as a surgical intervention.  The patient's history has been reviewed, patient examined, no change in status, stable for surgery.  I have reviewed the patient's chart and labs.  Questions were answered to the patient's satisfaction.     Cyndie Woodbeck C Carrissa Taitano

## 2021-04-11 NOTE — Anesthesia Preprocedure Evaluation (Signed)
Anesthesia Evaluation  Patient identified by MRN, date of birth, ID band Patient awake    Reviewed: Allergy & Precautions, H&P , NPO status , Patient's Chart, lab work & pertinent test results, reviewed documented beta blocker date and time   Airway Mallampati: II  TM Distance: >3 FB Neck ROM: full    Dental  (+) Teeth Intact   Pulmonary neg pulmonary ROS, former smoker,    Pulmonary exam normal        Cardiovascular Exercise Tolerance: Good hypertension, On Medications negative cardio ROS Normal cardiovascular exam Rate:Normal     Neuro/Psych negative neurological ROS  negative psych ROS   GI/Hepatic Neg liver ROS, GERD  Medicated,  Endo/Other  negative endocrine ROSdiabetes, Well Controlled, Type 2, Oral Hypoglycemic Agents  Renal/GU negative Renal ROS  negative genitourinary   Musculoskeletal   Abdominal   Peds  Hematology negative hematology ROS (+)   Anesthesia Other Findings   Reproductive/Obstetrics negative OB ROS                             Anesthesia Physical Anesthesia Plan  ASA: 3  Anesthesia Plan: General LMA   Post-op Pain Management:    Induction:   PONV Risk Score and Plan:   Airway Management Planned:   Additional Equipment:   Intra-op Plan:   Post-operative Plan:   Informed Consent: I have reviewed the patients History and Physical, chart, labs and discussed the procedure including the risks, benefits and alternatives for the proposed anesthesia with the patient or authorized representative who has indicated his/her understanding and acceptance.       Plan Discussed with: CRNA  Anesthesia Plan Comments:         Anesthesia Quick Evaluation

## 2021-04-11 NOTE — Patient Instructions (Addendum)
We are planning to take you to the operating room this afternoon for a left ureteral stent placement to relieve the blockage caused by your kidney stone and allow [place] to treat your urinary infection.  Please report to Same Day Surgery at the Medical Mall entrance of the hospital at 2pm this afternoon. Please do not eat or drink anything in advance of your procedure.  I expect we will be able to discharge you home following procedure today.

## 2021-04-11 NOTE — Progress Notes (Signed)
IM Injection  Patient is present today for an IM Injection for treatment of left ureteral stone.  Drug: Ceftriaxone Dose:1g Location: Right deltoid Lot: [id number] Exp:06/2023 Patient tolerated well, no complications were noted  Performed by: Franchot Erichsen, CMA

## 2021-04-11 NOTE — Anesthesia Postprocedure Evaluation (Signed)
Anesthesia Post Note  Patient: Chung-Haw Baquero  Procedure(s) Performed: CYSTOSCOPY/URETEROSCOPY/STENT PLACEMENT (Left: Ureter)  Patient location during evaluation: PACU Anesthesia Type: General Level of consciousness: awake and alert Pain management: pain level controlled Vital Signs Assessment: post-procedure vital signs reviewed and stable Respiratory status: spontaneous breathing, nonlabored ventilation, respiratory function stable and patient connected to nasal cannula oxygen Cardiovascular status: blood pressure returned to baseline and stable Postop Assessment: no apparent nausea or vomiting Anesthetic complications: no   No notable events documented.   Last Vitals:  Vitals:   04/11/21 1909 04/11/21 1914  BP: (!) 152/89 (!) 169/93  Pulse: 71 77  Resp: 13 18  Temp: (!) 36.1 C   SpO2: 98% 100%    Last Pain:  Vitals:   04/11/21 1909  TempSrc:   PainSc: 0-No pain                 Yevette Edwards

## 2021-04-11 NOTE — Op Note (Signed)
Preoperative diagnosis:  Left proximal ureteral calculus UTI  Postoperative diagnosis:  Same  Procedure:  Cystoscopy Left ureteral stent placement (60F/24 cm)  Left retrograde pyelography with interpretation   Surgeon: Lorin Picket C. Aqeel Norgaard, M.D.  Anesthesia: General  Complications: None  Intraoperative findings:  Cystoscopy: Urethra normal in caliber without stricture.  Prominent lateral lobe enlargement with moderate bladder neck elevation.  Bladder mucosa without erythema, solid or papillary lesions.  UOs orthotopic.  Mild bladder trabeculation Left retrograde pyelogram: Moderate left hydronephrosis  EBL: Minimal  Specimens: Urine culture left renal pelvis  Indication: Jacob Knox is a 75 y.o. patient with a 13 mm left proximal ureteral calculus.  He was seen yesterday and to be scheduled for ureteroscopic stone removal after discussing treatment options.  He developed a temp to 100.5 last night and called the office this morning and was asked to come in for further evaluation.  He was afebrile with stable vital signs however urinalysis showed >30 WBCs.  Urine culture was ordered and he received 1 g of ceftriaxone.  Stent placement was recommended.  After reviewing the management options for treatment, he elected to proceed with the above surgical procedure(s). We have discussed the potential benefits and risks of the procedure, side effects of the proposed treatment, the likelihood of the patient achieving the goals of the procedure, and any potential problems that might occur during the procedure or recuperation. Informed consent has been obtained.  Description of procedure:  The patient was taken to the operating room and general anesthesia was induced.  The patient was placed in the dorsal lithotomy position, prepped and draped in the usual sterile fashion.  Ceftriaxone was given this morning.  A preoperative time-out was performed.   A 21 French cystoscope was lubricated, passed  per urethra and advanced proximally into the bladder under direct vision with findings as described above.  A 0.38 Sensor guidewire was then advanced up the left ureter into the renal pelvis under fluoroscopic guidance.  The calculus was easily visualized and no significant resistance with wire passage.  An open-ended ureteral catheter was then advanced over the guidewire into the renal pelvis.  The stone was noted to migrate to the renal pelvis with placement of the ureteral catheter.  The guidewire was removed and 15 cc of cloudy urine was aspirated and sent for culture.  Retrograde pyelogram was then performed through the ureteral catheter with findings as described above.  The guidewire was replaced and the ureteral catheter was removed.  A 60F/24 cm Contour ureteral stent was advanced over the guidewire without difficulty.  The guidewire was removed and a good curl was noted proximally under fluoroscopy and distally under direct vision.  The bladder was then emptied and the cystoscope was removed.  After anesthetic reversal he was transported to the PACU in stable condition.  Plan: He will be discharged home if stable vital signs in PACU He will be scheduled for definitive stone removal in approximately 2 weeks   Irineo Axon, MD

## 2021-04-12 ENCOUNTER — Encounter: Payer: Self-pay | Admitting: Urology

## 2021-04-12 LAB — MICROSCOPIC EXAMINATION: RBC, Urine: >30 /hpf — AB (ref 0–2)

## 2021-04-12 LAB — URINALYSIS, COMPLETE
Bilirubin, UA: NEGATIVE
Glucose, UA: NEGATIVE
Ketones, UA: NEGATIVE
Nitrite, UA: NEGATIVE
Protein,UA: NEGATIVE
Specific Gravity, UA: 1.01 (ref 1.005–1.030)
Urobilinogen, Ur: 0.2 mg/dL (ref 0.2–1.0)
pH, UA: 6 (ref 5.0–7.5)

## 2021-04-13 LAB — URINE CULTURE: Culture: NO GROWTH

## 2021-04-16 LAB — CULTURE, URINE COMPREHENSIVE

## 2021-04-18 ENCOUNTER — Other Ambulatory Visit: Payer: Self-pay

## 2021-04-18 ENCOUNTER — Telehealth: Payer: Self-pay

## 2021-04-18 DIAGNOSIS — N201 Calculus of ureter: Secondary | ICD-10-CM

## 2021-04-18 DIAGNOSIS — I10 Essential (primary) hypertension: Secondary | ICD-10-CM | POA: Insufficient documentation

## 2021-04-18 DIAGNOSIS — M1990 Unspecified osteoarthritis, unspecified site: Secondary | ICD-10-CM | POA: Insufficient documentation

## 2021-04-18 DIAGNOSIS — N400 Enlarged prostate without lower urinary tract symptoms: Secondary | ICD-10-CM | POA: Insufficient documentation

## 2021-04-18 DIAGNOSIS — I2510 Atherosclerotic heart disease of native coronary artery without angina pectoris: Secondary | ICD-10-CM | POA: Insufficient documentation

## 2021-04-18 NOTE — Telephone Encounter (Signed)
Spoke with patient and agreed on surgical date of 04/23/21 for Left URS, LL, stent exchange w/Right RGP with Dr. Lonna Cobb. Patient is taking ASA 81mg  with hx of CAD. Patient states last seen by Dr. in 11-2020. Patient was given surgery instructions (NPO, driver, arrival time number) Called Dr. 09-13-2004 office and spoke with America Brown she asked for a clearance form to be faxed to the office and she would have him review today and fax back. Clearance form was faxed to [phone] to Heather's attention, fax confirmation. Awaiting response

## 2021-04-19 NOTE — Telephone Encounter (Signed)
Clearance received from Dr. Lady Gary patient is cleared to stop ASA 5-7 days prior to surgery (scanned under media) Patient was notified to stop ASA

## 2021-04-22 ENCOUNTER — Encounter
Admission: RE | Admit: 2021-04-22 | Discharge: 2021-04-22 | Disposition: A | Discharge status: Home / Self Care | Payer: Medicare Other | Source: Ambulatory Visit | Attending: Urology | Admitting: Urology

## 2021-04-22 ENCOUNTER — Other Ambulatory Visit: Payer: Self-pay

## 2021-04-22 HISTORY — DX: Other intervertebral disc degeneration, lumbar region: M51.36

## 2021-04-22 HISTORY — DX: Unspecified injury of lower back, initial encounter: S39.92XA

## 2021-04-22 HISTORY — DX: Other intervertebral disc degeneration, lumbar region without mention of lumbar back pain or lower extremity pain: M51.369

## 2021-04-22 MED ORDER — GENTAMICIN SULFATE 40 MG/ML IJ SOLN
5.0000 mg/kg | INTRAVENOUS | Status: AC
  Administered 2021-04-23: 370 mg via INTRAVENOUS
  Filled 2021-04-22: qty 9.25

## 2021-04-22 NOTE — Patient Instructions (Addendum)
Your procedure is scheduled on: April 23, 2021 TUESDAY Report to the Registration Desk on the 1st floor of the Medical Mall. To find out your arrival time, please call [phone] between 1PM - 3PM on: Monday April 22, 2021  REMEMBER: Instructions that are not followed completely may result in serious medical risk, up to and including death; or upon the discretion of your surgeon and anesthesiologist your surgery may need to be rescheduled.  DO NOT EAT OR DRINK after midnight the night before surgery.  No gum chewing, lozengers or hard candies.   TAKE THESE MEDICATIONS THE MORNING OF SURGERY WITH A SIP OF WATER: GUANFACINE AMLODIPINE ATORVASTATIN  Stop Metformin 2 days prior to surgery. LAST DOSE OF METFORMIN April 20, 2021  Follow recommendations from Cardiologist, Pulmonologist or PCP regarding stopping Aspirin. NO ASPIRIN FOR 5-7 DAYS BEFORE SURGERY. NO ASPIRIN AFTER April 17, 2021  One week prior to surgery: Stop Anti-inflammatories (NSAIDS) such as Advil, Aleve, Ibuprofen, Motrin, Naproxen, Naprosyn and Aspirin based products such as Excedrin, Goodys Powder, BC Powder. Stop ANY OVER THE COUNTER supplements until after surgery. You may however, continue to take Tylenol if needed for pain up until the day of surgery.  No Alcohol for 24 hours before or after surgery.  No Smoking including e-cigarettes for 24 hours prior to surgery.  No chewable tobacco products for at least 6 hours prior to surgery.  No nicotine patches on the day of surgery.  Do not use any "recreational" drugs for at least a week prior to your surgery.  Please be advised that the combination of cocaine and anesthesia may have negative outcomes, up to and including death. If you test positive for cocaine, your surgery will be cancelled.  On the morning of surgery brush your teeth with toothpaste and water, you may rinse your mouth with mouthwash if you wish. Do not swallow any toothpaste or  mouthwash.  Do not wear jewelry, make-up, hairpins, clips or nail polish.  Do not wear lotions, powders, or perfumes DEODORANT   Do not shave body from the neck down 48 hours prior to surgery just in case you cut yourself which could leave a site for infection.  Also, freshly shaved skin may become irritated if using the CHG soap.  Contact lenses, hearing aids and dentures may not be worn into surgery.  Do not bring valuables to the hospital. [facility] is not responsible for any missing/lost belongings or valuables.   SHOWER DAY OF SURGERY  Notify your doctor if there is any change in your medical condition (cold, fever, infection).  Wear comfortable clothing (specific to your surgery type) to the hospital.  After surgery, you can help prevent lung complications by doing breathing exercises.  Take deep breaths and cough every 1-2 hours. Your doctor may order a device called an Incentive Spirometer to help you take deep breaths. When coughing or sneezing, hold a pillow firmly against your incision with both hands. This is called "splinting." Doing this helps protect your incision. It also decreases belly discomfort.  If you are being discharged the day of surgery, you will not be allowed to drive home. You will need a responsible adult (18 years or older) to drive you home and stay with you that night.   Please call the Pre-admissions Testing Dept. at [phone] if you have any questions about these instructions.  Surgery Visitation Policy:  Patients undergoing a surgery or procedure may have one family member or support person with them as  long as that person is not COVID-19 positive or experiencing its symptoms.  That person may remain in the waiting area during the procedure.  Inpatient Visitation:    Visiting hours are 7 a.m. to 8 p.m. Inpatients will be allowed two visitors daily. The visitors may change each day during the patient's stay. No visitors under the age of  29. Any visitor under the age of 28 must be accompanied by an adult. The visitor must pass COVID-19 screenings, use hand sanitizer when entering and exiting the patient's room and wear a mask at all times, including in the patient's room. Patients must also wear a mask when staff or their visitor are in the room. Masking is required regardless of vaccination status.

## 2021-04-23 ENCOUNTER — Encounter
Admission: RE | Disposition: A | Discharge status: Home / Self Care | Payer: Self-pay | Source: Home / Self Care | Attending: Urology

## 2021-04-23 ENCOUNTER — Ambulatory Visit: Payer: Medicare Other

## 2021-04-23 ENCOUNTER — Other Ambulatory Visit: Payer: Self-pay

## 2021-04-23 ENCOUNTER — Encounter: Payer: Self-pay | Admitting: Urology

## 2021-04-23 ENCOUNTER — Ambulatory Visit: Payer: Medicare Other | Admitting: Urgent Care

## 2021-04-23 ENCOUNTER — Telehealth: Payer: Self-pay | Admitting: Urology

## 2021-04-23 ENCOUNTER — Ambulatory Visit
Admission: RE | Admit: 2021-04-23 | Discharge: 2021-04-23 | Disposition: A | Discharge status: Home / Self Care | Payer: Medicare Other | Attending: Urology | Admitting: Urology

## 2021-04-23 DIAGNOSIS — Z888 Allergy status to other drugs, medicaments and biological substances status: Secondary | ICD-10-CM | POA: Insufficient documentation

## 2021-04-23 DIAGNOSIS — Z79899 Other long term (current) drug therapy: Secondary | ICD-10-CM | POA: Insufficient documentation

## 2021-04-23 DIAGNOSIS — Z7984 Long term (current) use of oral hypoglycemic drugs: Secondary | ICD-10-CM | POA: Insufficient documentation

## 2021-04-23 DIAGNOSIS — Z91018 Allergy to other foods: Secondary | ICD-10-CM | POA: Diagnosis not present

## 2021-04-23 DIAGNOSIS — Z7982 Long term (current) use of aspirin: Secondary | ICD-10-CM | POA: Diagnosis not present

## 2021-04-23 DIAGNOSIS — Z87891 Personal history of nicotine dependence: Secondary | ICD-10-CM | POA: Insufficient documentation

## 2021-04-23 DIAGNOSIS — N200 Calculus of kidney: Secondary | ICD-10-CM | POA: Insufficient documentation

## 2021-04-23 DIAGNOSIS — N201 Calculus of ureter: Secondary | ICD-10-CM

## 2021-04-23 HISTORY — PX: CYSTOSCOPY W/ RETROGRADES: SHX1426

## 2021-04-23 HISTORY — PX: CYSTOSCOPY/URETEROSCOPY/HOLMIUM LASER/STENT PLACEMENT: SHX6546

## 2021-04-23 LAB — GLUCOSE, CAPILLARY
Glucose-Capillary: 155 mg/dL — ABNORMAL HIGH (ref 70–99)
Glucose-Capillary: 164 mg/dL — ABNORMAL HIGH (ref 70–99)

## 2021-04-23 SURGERY — CYSTOSCOPY/URETEROSCOPY/HOLMIUM LASER/STENT PLACEMENT
Anesthesia: General | Laterality: Right

## 2021-04-23 MED ORDER — FAMOTIDINE 20 MG PO TABS
ORAL_TABLET | ORAL | Status: AC
  Administered 2021-04-23: 20 mg via ORAL
  Filled 2021-04-23: qty 1

## 2021-04-23 MED ORDER — SULFAMETHOXAZOLE-TRIMETHOPRIM 800-160 MG PO TABS: 1.0000 | ORAL_TABLET | Freq: Two times a day (BID) | ORAL | 0 refills | Status: AC

## 2021-04-23 MED ORDER — ORAL CARE MOUTH RINSE: 15.0000 mL | Freq: Once | OROMUCOSAL | Status: AC

## 2021-04-23 MED ORDER — FENTANYL CITRATE (PF) 100 MCG/2ML IJ SOLN
INTRAMUSCULAR | Status: AC
  Administered 2021-04-23: 25 ug via INTRAVENOUS
  Filled 2021-04-23: qty 2

## 2021-04-23 MED ORDER — CHLORHEXIDINE GLUCONATE 0.12 % MT SOLN
OROMUCOSAL | Status: AC
  Administered 2021-04-23: 15 mL via OROMUCOSAL
  Filled 2021-04-23: qty 15

## 2021-04-23 MED ORDER — FENTANYL CITRATE (PF) 100 MCG/2ML IJ SOLN
25.0000 ug | INTRAMUSCULAR | Status: DC | PRN
  Administered 2021-04-23 (×2): 25 ug via INTRAVENOUS

## 2021-04-23 MED ORDER — TAMSULOSIN HCL 0.4 MG PO CAPS: 0.4000 mg | ORAL_CAPSULE | Freq: Every day | ORAL | 0 refills | Status: AC

## 2021-04-23 MED ORDER — 0.9 % SODIUM CHLORIDE (POUR BTL) OPTIME
TOPICAL | Status: DC | PRN
  Administered 2021-04-23: 200 mL

## 2021-04-23 MED ORDER — MEPERIDINE HCL 25 MG/ML IJ SOLN: 6.2500 mg | INTRAMUSCULAR | Status: DC | PRN

## 2021-04-23 MED ORDER — IOHEXOL 180 MG/ML  SOLN
INTRAMUSCULAR | Status: DC | PRN
  Administered 2021-04-23: 10 mL

## 2021-04-23 MED ORDER — ONDANSETRON HCL 4 MG/2ML IJ SOLN
INTRAMUSCULAR | Status: DC | PRN
  Administered 2021-04-23: 4 mg via INTRAVENOUS

## 2021-04-23 MED ORDER — DEXAMETHASONE SODIUM PHOSPHATE 10 MG/ML IJ SOLN
INTRAMUSCULAR | Status: DC | PRN
  Administered 2021-04-23: 5 mg via INTRAVENOUS

## 2021-04-23 MED ORDER — DEXAMETHASONE SODIUM PHOSPHATE 10 MG/ML IJ SOLN
INTRAMUSCULAR | Status: AC
  Filled 2021-04-23: qty 1

## 2021-04-23 MED ORDER — SUGAMMADEX SODIUM 200 MG/2ML IV SOLN
INTRAVENOUS | Status: DC | PRN
  Administered 2021-04-23: 180 mg via INTRAVENOUS

## 2021-04-23 MED ORDER — ROCURONIUM BROMIDE 100 MG/10ML IV SOLN
INTRAVENOUS | Status: DC | PRN
  Administered 2021-04-23: 50 mg via INTRAVENOUS

## 2021-04-23 MED ORDER — ONDANSETRON HCL 4 MG/2ML IJ SOLN
INTRAMUSCULAR | Status: AC
  Filled 2021-04-23: qty 2

## 2021-04-23 MED ORDER — PROPOFOL 10 MG/ML IV BOLUS
INTRAVENOUS | Status: DC | PRN
  Administered 2021-04-23: 180 mg via INTRAVENOUS

## 2021-04-23 MED ORDER — ONDANSETRON HCL 4 MG/2ML IJ SOLN: 4.0000 mg | Freq: Once | INTRAMUSCULAR | Status: DC | PRN

## 2021-04-23 MED ORDER — EPHEDRINE SULFATE 50 MG/ML IJ SOLN
INTRAMUSCULAR | Status: DC | PRN
Start: 2021-04-23 — End: 2021-04-23
  Administered 2021-04-23: 10 mg via INTRAVENOUS

## 2021-04-23 MED ORDER — FENTANYL CITRATE (PF) 100 MCG/2ML IJ SOLN
INTRAMUSCULAR | Status: AC
  Filled 2021-04-23: qty 2

## 2021-04-23 MED ORDER — FAMOTIDINE 20 MG PO TABS: 20.0000 mg | ORAL_TABLET | Freq: Once | ORAL | Status: AC

## 2021-04-23 MED ORDER — FENTANYL CITRATE (PF) 100 MCG/2ML IJ SOLN
INTRAMUSCULAR | Status: DC | PRN
  Administered 2021-04-23: 50 ug via INTRAVENOUS

## 2021-04-23 MED ORDER — SODIUM CHLORIDE 0.9 % IV SOLN: INTRAVENOUS | Status: DC

## 2021-04-23 MED ORDER — ROCURONIUM BROMIDE 10 MG/ML (PF) SYRINGE
PREFILLED_SYRINGE | INTRAVENOUS | Status: AC
  Filled 2021-04-23: qty 10

## 2021-04-23 MED ORDER — LIDOCAINE HCL (CARDIAC) PF 100 MG/5ML IV SOSY
PREFILLED_SYRINGE | INTRAVENOUS | Status: DC | PRN
  Administered 2021-04-23: 80 mg via INTRAVENOUS

## 2021-04-23 MED ORDER — EPHEDRINE 5 MG/ML INJ
INTRAVENOUS | Status: AC
  Filled 2021-04-23: qty 5

## 2021-04-23 MED ORDER — PROPOFOL 10 MG/ML IV BOLUS
INTRAVENOUS | Status: AC
  Filled 2021-04-23: qty 40

## 2021-04-23 MED ORDER — CHLORHEXIDINE GLUCONATE 0.12 % MT SOLN: 15.0000 mL | Freq: Once | OROMUCOSAL | Status: AC

## 2021-04-23 SURGICAL SUPPLY — 33 items
BAG DRAIN CYSTO-URO LG1000N (MISCELLANEOUS) ×3 IMPLANT
BASKET LASER NITINOL 1.9FR (BASKET) ×3 IMPLANT
BASKET ZERO TIP 1.9FR (BASKET) IMPLANT
BRUSH SCRUB EZ  4% CHG (MISCELLANEOUS) ×1
BRUSH SCRUB EZ 1% IODOPHOR (MISCELLANEOUS) ×3 IMPLANT
BRUSH SCRUB EZ 4% CHG (MISCELLANEOUS) ×2 IMPLANT
CATH URET FLEX-TIP 2 LUMEN 10F (CATHETERS) IMPLANT
CATH URETL OPEN 5X70 (CATHETERS) ×3 IMPLANT
CNTNR SPEC 2.5X3XGRAD LEK (MISCELLANEOUS)
CONT SPEC 4OZ STER OR WHT (MISCELLANEOUS)
CONTAINER SPEC 2.5X3XGRAD LEK (MISCELLANEOUS) IMPLANT
DRAPE UTILITY 15X26 TOWEL STRL (DRAPES) ×3 IMPLANT
GAUZE 4X4 16PLY ~~LOC~~+RFID DBL (SPONGE) ×6 IMPLANT
GLOVE SURG UNDER POLY LF SZ7.5 (GLOVE) ×3 IMPLANT
GOWN STRL REUS W/ TWL LRG LVL3 (GOWN DISPOSABLE) ×2 IMPLANT
GOWN STRL REUS W/ TWL XL LVL3 (GOWN DISPOSABLE) ×2 IMPLANT
GOWN STRL REUS W/TWL LRG LVL3 (GOWN DISPOSABLE) ×1
GOWN STRL REUS W/TWL XL LVL3 (GOWN DISPOSABLE) ×1
GUIDEWIRE STR DUAL SENSOR (WIRE) ×6 IMPLANT
INFUSOR MANOMETER BAG 3000ML (MISCELLANEOUS) ×3 IMPLANT
IV NS IRRIG 3000ML ARTHROMATIC (IV SOLUTION) ×3 IMPLANT
KIT TURNOVER CYSTO (KITS) ×3 IMPLANT
MANIFOLD NEPTUNE II (INSTRUMENTS) ×3 IMPLANT
PACK CYSTO AR (MISCELLANEOUS) ×3 IMPLANT
SET CYSTO W/LG BORE CLAMP LF (SET/KITS/TRAYS/PACK) ×3 IMPLANT
SHEATH URETERAL 12FR 45CM (SHEATH) ×3 IMPLANT
SHEATH URETERAL 12FRX35CM (MISCELLANEOUS) IMPLANT
STENT URET 6FRX24 CONTOUR (STENTS) ×3 IMPLANT
STENT URET 6FRX26 CONTOUR (STENTS) IMPLANT
SURGILUBE 2OZ TUBE FLIPTOP (MISCELLANEOUS) ×3 IMPLANT
TRACTIP FLEXIVA PULSE ID 200 (Laser) ×3 IMPLANT
VALVE UROSEAL ADJ ENDO (VALVE) ×3 IMPLANT
WATER STERILE IRR 1000ML POUR (IV SOLUTION) ×3 IMPLANT

## 2021-04-23 NOTE — Anesthesia Procedure Notes (Signed)
Procedure Name: Intubation Date/Time: 04/23/2021 1:21 PM Performed by: Henrietta Hoover, CRNA Pre-anesthesia Checklist: Patient identified, Emergency Drugs available, Suction available and Patient being monitored Patient Re-evaluated:Patient Re-evaluated prior to induction Oxygen Delivery Method: Circle system utilized Preoxygenation: Pre-oxygenation with 100% oxygen Induction Type: IV induction and Cricoid Pressure applied Ventilation: Mask ventilation without difficulty Laryngoscope Size: McGraph and 4 Grade View: Grade I Tube type: Oral Tube size: 7.0 mm Number of attempts: 1 Airway Equipment and Method: Stylet and Video-laryngoscopy Placement Confirmation: ETT inserted through vocal cords under direct vision, positive ETCO2 and breath sounds checked- equal and bilateral Secured at: 21 cm Tube secured with: Tape Dental Injury: Teeth and Oropharynx as per pre-operative assessment

## 2021-04-23 NOTE — Transfer of Care (Signed)
Immediate Anesthesia Transfer of Care Note  Patient: Jacob Knox  Procedure(s) Performed: CYSTOSCOPY/URETEROSCOPY/HOLMIUM LASER/STENT PLACEMENT (Left) CYSTOSCOPY WITH RETROGRADE PYELOGRAM (Right)  Patient Location: PACU  Anesthesia Type:General  Level of Consciousness: awake, drowsy and patient cooperative  Airway & Oxygen Therapy: Patient Spontanous Breathing and Patient connected to face mask oxygen  Post-op Assessment: Report given to RN, Post -op Vital signs reviewed and stable and Patient moving all extremities  Post vital signs: Reviewed and stable  Last Vitals:  Vitals Value Taken Time  BP 127/65 04/23/21 1438  Temp 36.1 C 04/23/21 1438  Pulse 67 04/23/21 1442  Resp 13 04/23/21 1442  SpO2 100 % 04/23/21 1442  Vitals shown include unvalidated device data.  Last Pain:  Vitals:   04/23/21 1438  TempSrc:   PainSc: 0-No pain         Complications: No notable events documented.

## 2021-04-23 NOTE — Telephone Encounter (Signed)
Please schedule cystoscopy with stent removal approximately 2 weeks  APPT MADE AND GIVEN TO CHRISTINA IN PACU

## 2021-04-23 NOTE — Interval H&P Note (Signed)
History and Physical Interval Note:  Left ureteral stent placed 04/11/2021 for an obstructing left ureteral calculus with pyuria and low-grade fever.  Intraoperative cultures from the renal pelvis were negative.  He presents today for definitive stone management.  He will also undergo right retrograde pyelogram with a history of microhematuria.  CV: RRR Lungs: Clear  04/23/2021 1:01 PM  Chung-Haw Mincey  has presented today for surgery, with the diagnosis of Left Ureteral Calculus, microhematuria.  The various methods of treatment have been discussed with the patient and family. After consideration of risks, benefits and other options for treatment, the patient has consented to  Procedure(s): CYSTOSCOPY/URETEROSCOPY/HOLMIUM LASER/STENT PLACEMENT (Left) CYSTOSCOPY WITH RETROGRADE PYELOGRAM (Right) as a surgical intervention.  The patient's history has been reviewed, patient examined, no change in status, stable for surgery.  I have reviewed the patient's chart and labs.  Questions were answered to the patient's satisfaction.     Rudolf Blizard C Jayvian Escoe

## 2021-04-23 NOTE — Anesthesia Postprocedure Evaluation (Signed)
Anesthesia Post Note  Patient: Jacob Knox  Procedure(s) Performed: CYSTOSCOPY/URETEROSCOPY/HOLMIUM LASER/STENT PLACEMENT (Left) CYSTOSCOPY WITH RETROGRADE PYELOGRAM (Right)  Patient location during evaluation: PACU Anesthesia Type: General Level of consciousness: awake and alert, awake and oriented Pain management: pain level controlled Vital Signs Assessment: post-procedure vital signs reviewed and stable Respiratory status: spontaneous breathing, nonlabored ventilation and respiratory function stable Cardiovascular status: blood pressure returned to baseline and stable Postop Assessment: no apparent nausea or vomiting Anesthetic complications: no   No notable events documented.   Last Vitals:  Vitals:   04/23/21 1522 04/23/21 1527  BP:  123/82  Pulse: 70 67  Resp: 15 17  Temp:  (!) 36.2 C  SpO2: 100% 99%    Last Pain:  Vitals:   04/23/21 1527  TempSrc: Temporal  PainSc: 1                  Manfred Arch
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
## 2021-04-23 NOTE — Discharge Instructions (Addendum)
DISCHARGE INSTRUCTIONS FOR KIDNEY STONE/URETERAL STENT   MEDICATIONS:  1. Resume all your other meds from home.  2.  AZO (over-the-counter) can help with the burning/stinging when you urinate. 3.  Tamsulosin refill was sent to your pharmacy. 4.  Oxybutynin may help stent symptoms and can restart if desired 5.  Antibiotic Rx was sent to pharmacy x3 days  ACTIVITY:  1. May resume regular activities in 24 hours. 2. No driving while on narcotic pain medications  3. Drink plenty of water  4. Continue to walk at home - you can still get blood clots when you are at home, so keep active, but don't over do it.  5. May return to work/school tomorrow or when you feel ready    SIGNS/SYMPTOMS TO CALL:  Common postoperative symptoms include urinary frequency, urgency, bladder spasm and blood in the urine  Please call us if you have a fever greater than 101.5, uncontrolled nausea/vomiting, uncontrolled pain, dizziness, unable to urinate, excessively bloody urine, chest pain, shortness of breath, leg swelling, leg pain, or any other concerns or questions.   You can reach [place] at [phone].   FOLLOW-UP:  1. You we will be contacted for an appointment for stent removal in approximately 2 weeks  AMBULATORY SURGERY  DISCHARGE INSTRUCTIONS   The drugs that you were given will stay in your system until tomorrow so for the next 24 hours you should not:  Drive an automobile Make any legal decisions Drink any alcoholic beverage   You may resume regular meals tomorrow.  Today it is better to start with liquids and gradually work up to solid foods.  You may eat anything you prefer, but it is better to start with liquids, then soup and crackers, and gradually work up to solid foods.   Please notify your doctor immediately if you have any unusual bleeding, trouble breathing, redness and pain at the surgery site, drainage, fever, or pain not relieved by medication.    Additional  Instructions:        Please contact your physician with any problems or Same Day Surgery at [phone], Monday through Friday 6 am to 4 pm, or  at [facility] number at [phone].

## 2021-04-23 NOTE — Op Note (Signed)
Preoperative diagnosis: Left nephrolithiasis   Postoperative diagnosis: Left nephrolithiasis  Procedure:  Cystoscopy Left ureteroscopy and stone removal Ureteroscopic laser lithotripsy Left ureteral stent exchange (37F/24 cm)  Left retrograde pyelography with interpretation  Surgeon: Lorin Picket C. Chrystel Barefield, M.D.  Anesthesia: General  Complications: None  Intraoperative findings:  Cystoscopy: Urethra normal in caliber without stricture.  Prominent lateral lobe enlargement with moderate bladder neck elevation.  Bladder mucosa without erythema, solid or papillary lesions.  Mild bladder trabeculation.  Mild inflammatory changes left hemitrigone secondary to indwelling stent Ureteropyeloscopy: Ureter normal in appearance.  Calculus identified in lower pole calyx Left retrograde pyelogram post procedure demonstrated no extravasation or filling defects  EBL: Minimal  Specimens: Calculus fragments for analysis   Indication: Jacob Knox is a 75 y.o. male with a 13 mm left proximal ureter.  He underwent left ureteral stent placement due to low-grade fever and pyuria on 04/11/2021.  With stent placement the stone was pushed back into the renal pelvis.  Urine culture from the renal pelvis was negative.  He presents today for definitive stone treatment.  After reviewing the management options for treatment, the patient elected to proceed with the above surgical procedure(s). We have discussed the potential benefits and risks of the procedure, side effects of the proposed treatment, the likelihood of the patient achieving the goals of the procedure, and any potential problems that might occur during the procedure or recuperation. Informed consent has been obtained.  Description of procedure:  The patient was taken to the operating room and general anesthesia was induced.  The patient was placed in the dorsal lithotomy position, prepped and draped in the usual sterile fashion, and preoperative  antibiotics were administered. A preoperative time-out was performed.   A 21 French cystoscope was lubricated passed per urethra and advanced proximally under direct vision with findings as described above.  Attention was directed to the left ureteral stent which was grasped with endoscopic forceps and brought out to the urethral meatus.  A 0.038 Sensor wire was then placed through the stent into the renal pelvis under fluoroscopic guidance.  The ureteral stent was removed.  A single channel flexible digital ureteroscope was then passed per urethra.  The ureteroscope was unable to advanced into the left UO alongside the guidewire.  A second guidewire was placed through the ureteroscope and into the UO and the scope was advanced over the wire.  The guidewire was removed and the stent was advanced proximally with findings as described above.  The calculus was identified in the lower pole calyx.  It was placed in a 1.9 Fr Escape basket and relocated to an upper pole calyx.  A 242 m holmium laser fiber was then placed through the ureteroscope.  Dusting of the stone was attempted at 0.3 J / 60 Hz however due to hardness the calculus broke into fragments during dusting.  The second guidewire is placed to the ureteroscope and the ureteroscope was removed and a 12/14 French ureteral access sheath was placed over the working wire under fluoroscopic guidance without difficulty.  The fragments were then removed with a 1.9 [place] nitinol basket.  After fragment removal retrograde pyelogram was performed and each calyx was sequentially examined under fluoroscopic guidance and no fragments larger than the tip of the holmium laser were identified.  The ureteral access sheath and ureteroscope were removed in tandem and the ureter showed no evidence of injury or perforation.  A 6 FR/24 CM Contour ureteral stent was placed under fluoroscopic guidance.  The wire  was then removed with an adequate stent curl noted in  the renal pelvis as well as in the bladder.  The bladder was then emptied and the procedure ended.  The patient appeared to tolerate the procedure well and without complications.  After anesthetic reversal the patient was transported to the PACU in stable condition.   Plan: He will be scheduled for cystoscopy with stent removal in approximately 2 weeks   Jacob Axon, MD

## 2021-04-24 ENCOUNTER — Encounter: Payer: Self-pay | Admitting: Urology

## 2021-04-25 ENCOUNTER — Encounter: Payer: Self-pay | Admitting: Urology

## 2021-04-27 LAB — CALCULI, WITH PHOTOGRAPH (CLINICAL LAB)
Calcium Oxalate Monohydrate: 100 %
Weight Calculi: 104 mg

## 2021-05-03 ENCOUNTER — Telehealth: Payer: Self-pay

## 2021-05-03 NOTE — Telephone Encounter (Signed)
Incoming message left on triage line from pt who states that he believes his stent is "clogging" his urination. He states that he has seen blood clots however urine is now clear. He requests a call back.   Called pt informed him of normal parameters for hematuria with stent in place. Pt gave verbal understanding. He denies fever, chills, dysuria, and pain.

## 2021-05-06 ENCOUNTER — Ambulatory Visit: Payer: Medicare Other

## 2021-05-08 ENCOUNTER — Other Ambulatory Visit: Payer: Self-pay | Admitting: Urology

## 2021-05-09 ENCOUNTER — Encounter: Payer: Self-pay | Admitting: Urology

## 2021-05-09 ENCOUNTER — Other Ambulatory Visit: Payer: Self-pay

## 2021-05-09 ENCOUNTER — Ambulatory Visit (INDEPENDENT_AMBULATORY_CARE_PROVIDER_SITE_OTHER): Payer: Medicare Other | Admitting: Urology

## 2021-05-09 VITALS — BP 114/62 | HR 88 | Ht 69.5 in | Wt 163.0 lb

## 2021-05-09 DIAGNOSIS — Z09 Encounter for follow-up examination after completed treatment for conditions other than malignant neoplasm: Secondary | ICD-10-CM

## 2021-05-09 DIAGNOSIS — Z87442 Personal history of urinary calculi: Secondary | ICD-10-CM

## 2021-05-09 MED ORDER — LEVOFLOXACIN 500 MG PO TABS
500.0000 mg | ORAL_TABLET | Freq: Once | ORAL | Status: AC
Start: 2021-05-09 — End: 2021-05-09
  Administered 2021-05-09: 500 mg via ORAL

## 2021-05-09 NOTE — Progress Notes (Signed)
Indications: Patient is 75 y.o., who is s/p ureteroscopic removal of a 13 mm left renal calculus 04/23/2021.  No postoperative problems.  The patient is presenting today for stent removal.  Procedure:  Flexible Cystoscopy with stent removal (51102)  Timeout was performed and the correct patient, procedure and participants were identified.    Description:  The patient was prepped and draped in the usual sterile fashion. Flexible cystosopy was performed.  The stent was visualized, grasped, and removed intact without difficulty. The patient tolerated the procedure well.  A single dose of oral antibiotics was given.  Complications:  None  Plan:  Instructed to call for fever or flank pain post stent removal Stone analysis 100% calcium oxalate monohydrate He is a first-time stone former and we discussed general stone prevention guidelines versus a metabolic evaluation.  He elected general stone prevention guidelines and was provided literature.  We discussed increasing water intake to keep urine output >2.5 L/day; dietary oxalate moderation and increasing dietary citrate Follow-up 1 year with KUB   Jacob Axon, MD

## 2021-05-10 LAB — URINALYSIS, COMPLETE
Bilirubin, UA: NEGATIVE
Glucose, UA: NEGATIVE
Ketones, UA: NEGATIVE
Nitrite, UA: NEGATIVE
Specific Gravity, UA: 1.01 (ref 1.005–1.030)
Urobilinogen, Ur: 0.2 mg/dL (ref 0.2–1.0)
pH, UA: 5.5 (ref 5.0–7.5)

## 2021-05-10 LAB — MICROSCOPIC EXAMINATION
Bacteria, UA: NONE SEEN
RBC, Urine: >30 /hpf — AB (ref 0–2)

## 2021-05-30 ENCOUNTER — Ambulatory Visit (INDEPENDENT_AMBULATORY_CARE_PROVIDER_SITE_OTHER): Payer: Medicare Other | Admitting: Physician Assistant

## 2021-05-30 ENCOUNTER — Ambulatory Visit
Admission: RE | Admit: 2021-05-30 | Discharge: 2021-05-30 | Disposition: A | Discharge status: Home / Self Care | Payer: Medicare Other | Source: Ambulatory Visit | Attending: Physician Assistant | Admitting: Physician Assistant

## 2021-05-30 ENCOUNTER — Other Ambulatory Visit: Payer: Self-pay

## 2021-05-30 ENCOUNTER — Encounter: Payer: Self-pay | Admitting: Physician Assistant

## 2021-05-30 ENCOUNTER — Ambulatory Visit
Admission: RE | Admit: 2021-05-30 | Discharge: 2021-05-30 | Disposition: A | Discharge status: Home / Self Care | Payer: Medicare Other | Attending: Urology | Admitting: Urology

## 2021-05-30 VITALS — BP 138/88 | HR 99 | Ht 69.0 in | Wt 166.0 lb

## 2021-05-30 DIAGNOSIS — Z87442 Personal history of urinary calculi: Secondary | ICD-10-CM

## 2021-05-30 DIAGNOSIS — R109 Unspecified abdominal pain: Secondary | ICD-10-CM | POA: Diagnosis not present
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
## 2021-05-31 LAB — MICROSCOPIC EXAMINATION
Bacteria, UA: NONE SEEN
RBC, Urine: NONE SEEN /hpf (ref 0–2)

## 2021-05-31 LAB — URINALYSIS, COMPLETE
Bilirubin, UA: NEGATIVE
Leukocytes,UA: NEGATIVE
Nitrite, UA: NEGATIVE
RBC, UA: NEGATIVE
Specific Gravity, UA: >1.03 — ABNORMAL HIGH (ref 1.005–1.030)
Urobilinogen, Ur: 0.2 mg/dL (ref 0.2–1.0)
pH, UA: 5.5 (ref 5.0–7.5)

## 2021-06-12 ENCOUNTER — Ambulatory Visit
Admission: RE | Admit: 2021-06-12 | Discharge: 2021-06-12 | Disposition: A | Discharge status: Home / Self Care | Payer: Medicare Other | Source: Ambulatory Visit | Attending: Physician Assistant | Admitting: Physician Assistant

## 2021-06-12 DIAGNOSIS — R109 Unspecified abdominal pain: Secondary | ICD-10-CM | POA: Diagnosis not present

## 2021-06-12 DIAGNOSIS — Z87442 Personal history of urinary calculi: Secondary | ICD-10-CM | POA: Insufficient documentation

## 2021-06-20 ENCOUNTER — Telehealth: Payer: Self-pay

## 2021-06-20 NOTE — Telephone Encounter (Signed)
-----   Message from Carman Ching, [place] sent at 06/20/2021  3:33 PM EDT ----- Normal RUS, no indication of urinary obstruction. I do not think his flank pain is urologic in origin. Patient may follow up as needed. ----- Message ----- From: Interface, Rad Results In Sent: 06/13/2021  11:34 PM EDT To: Carman Ching, PA-C

## 2021-06-20 NOTE — Telephone Encounter (Signed)
LMOM notifying patient.

## 2022-01-11 IMAGING — US US RENAL
1 series · 14 of 25 positions shown · non-contrast
Comparison: Ultrasound report 06/08/2009

CLINICAL DATA: Right flank pain

EXAM:
RENAL / URINARY TRACT ULTRASOUND COMPLETE

[Series 1: us renal · 14 of 31 slices shown]
[im 1/31]
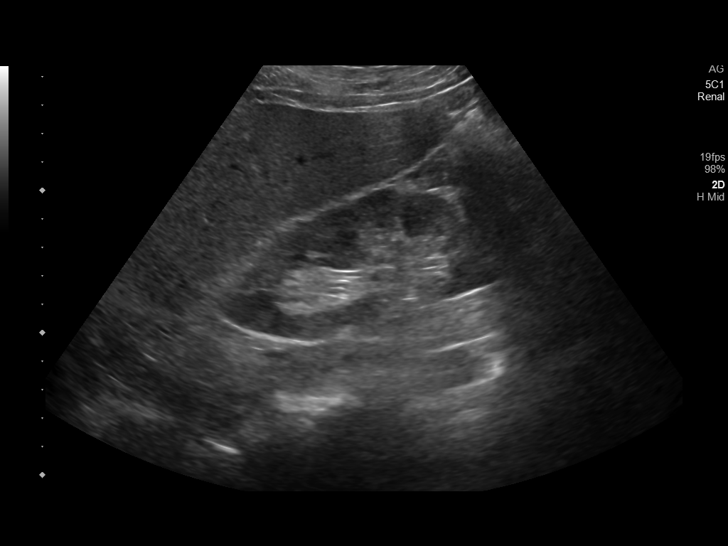
[im 3/31]
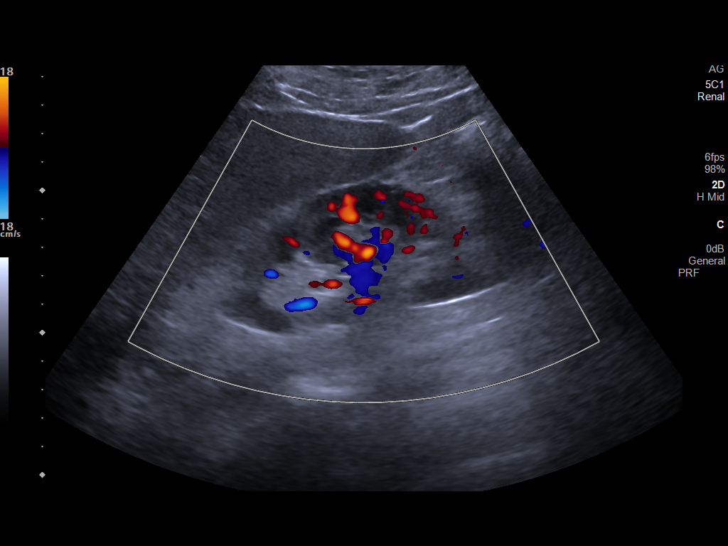
[im 6/31]
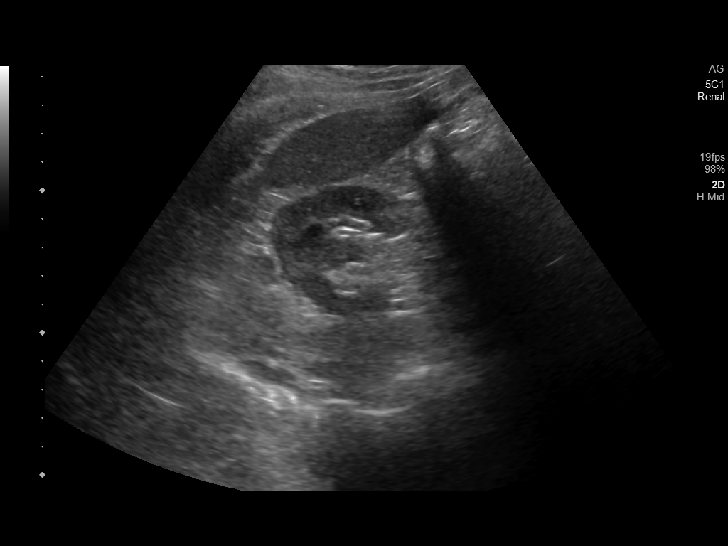
[im 8/31]
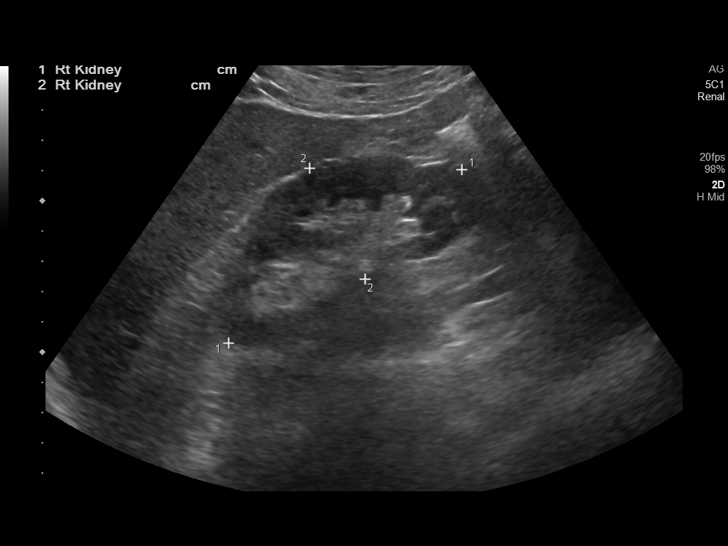
[im 11/31]
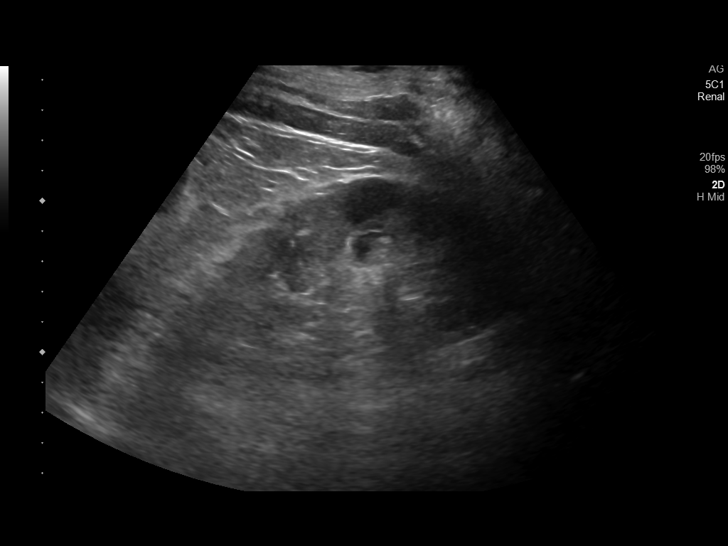
[im 12/31]
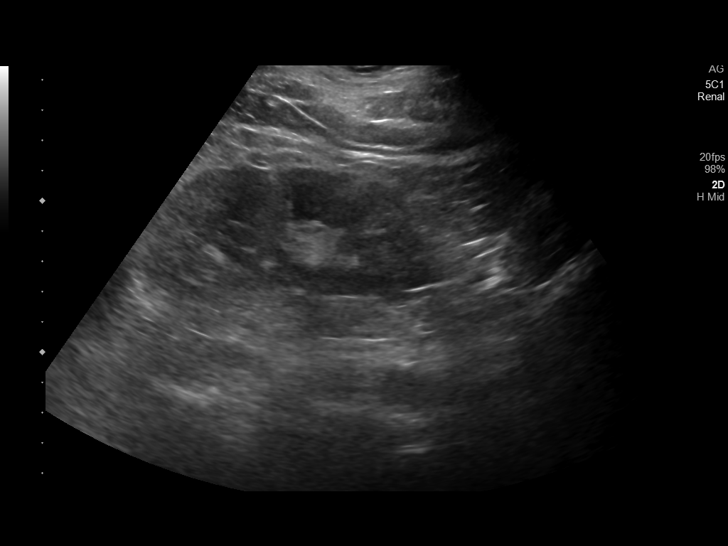
[im 14/31]
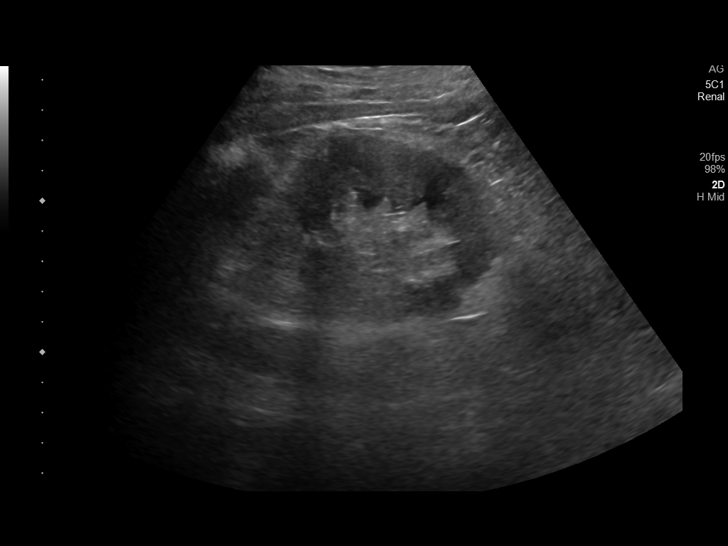
[im 17/31]
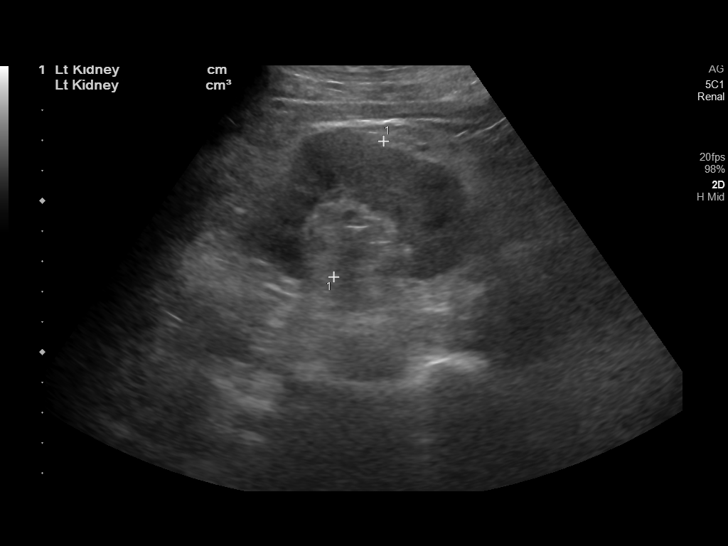
[im 19/31]
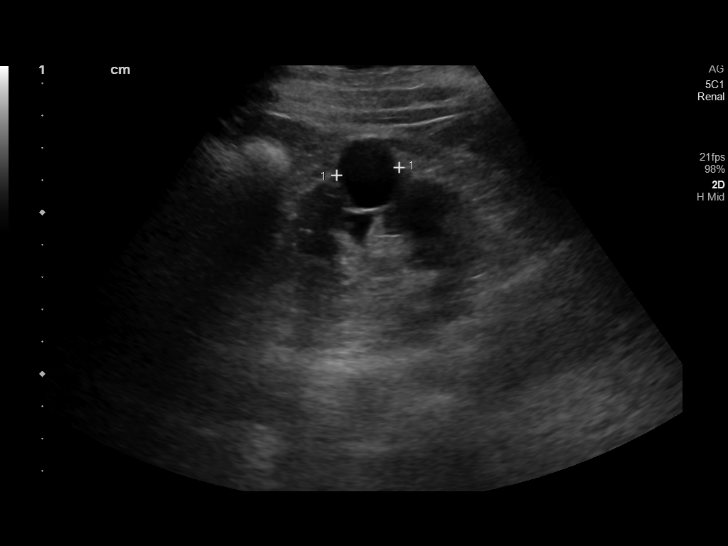
[im 21/31]
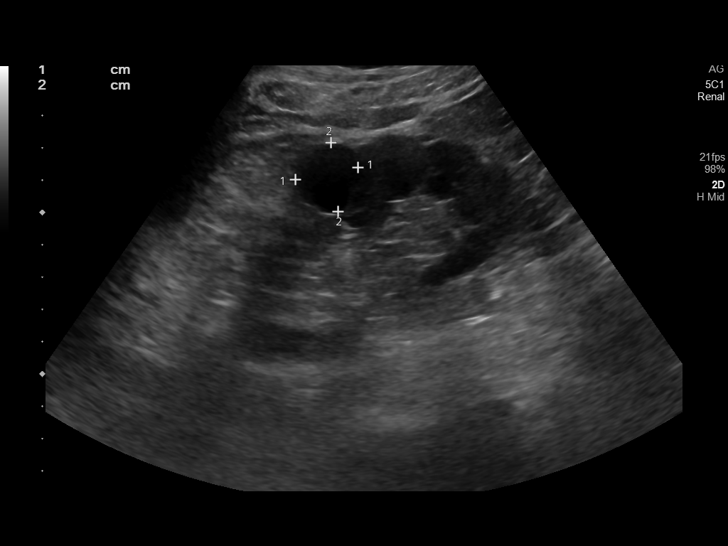
[im 23/31]
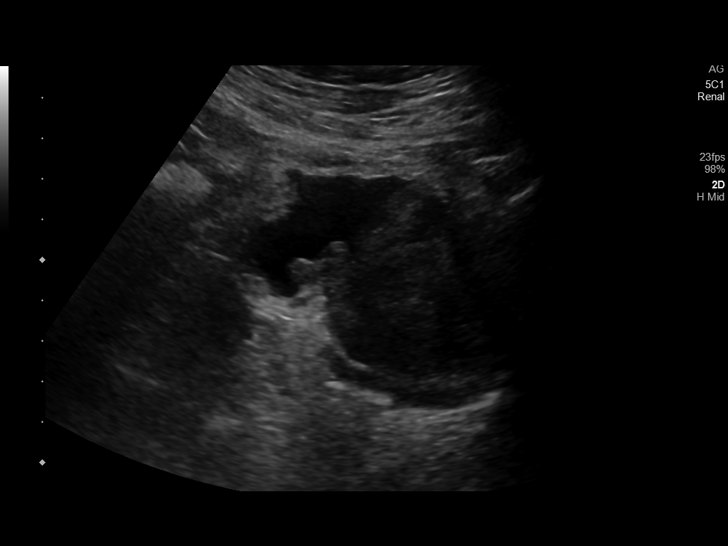
[im 26/31]
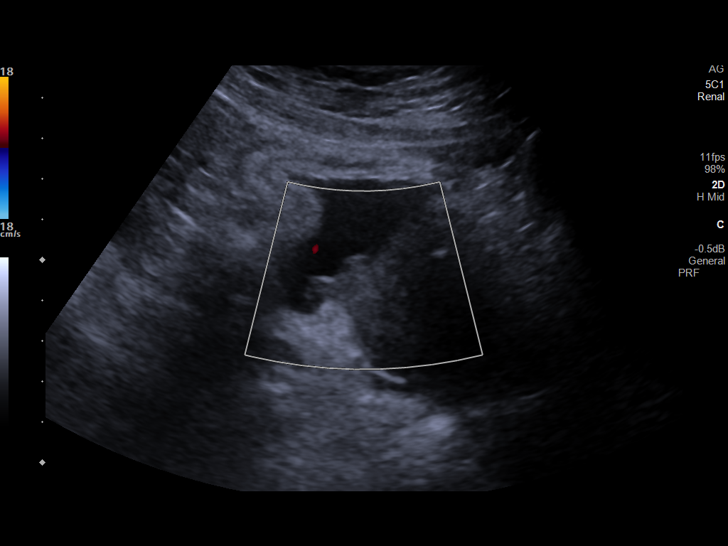
[im 28/31]
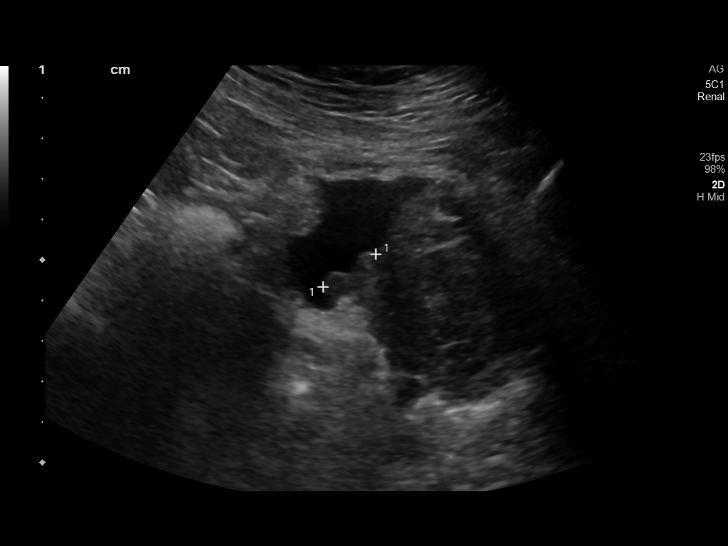
[im 31/31]
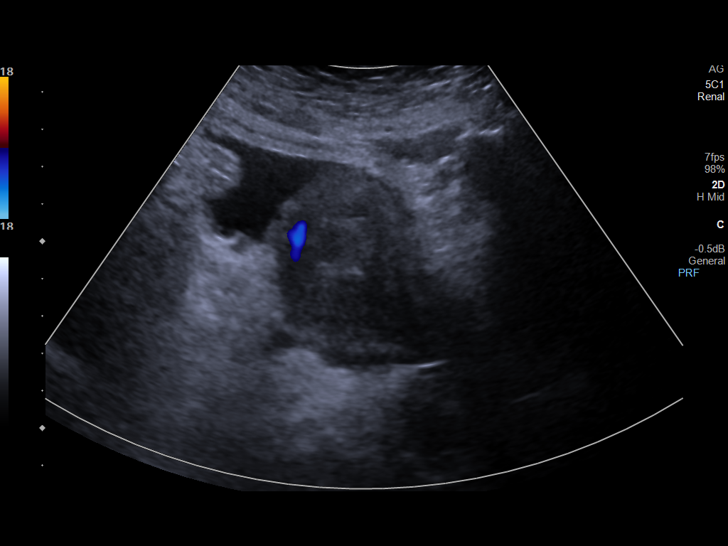

[14 of 25 positions shown; findings below may reference images not displayed]

FINDINGS: Right Kidney:

Renal measurements: 9.6 x 4.1 x 4.2 cm = volume: 87 mL. Echogenicity
within normal limits. No mass or hydronephrosis visualized.

Left Kidney:

Renal measurements: 9.9 x 6 x 4.8 cm = volume: 148 mL. Cortical
echogenicity within normal limits. No hydronephrosis. Cyst at the
midpole measuring 22 mm.

Bladder:

Appears normal for degree of bladder distention.

Other:

Enlarged prostate. Lobulated hypoechoic area at the posterior
bladder suspected to be due to enlarged prostate.
IMPRESSION: 1. Simple appearing cyst in the left kidney. The kidneys are
otherwise normal
2. Markedly enlarged prostate with lobulated masslike area posterior
bladder presumably related to the enlarged prostate.

## 2022-05-08 ENCOUNTER — Ambulatory Visit (INDEPENDENT_AMBULATORY_CARE_PROVIDER_SITE_OTHER): Payer: Medicare Other | Admitting: Urology

## 2022-05-08 ENCOUNTER — Ambulatory Visit
Admission: RE | Admit: 2022-05-08 | Discharge: 2022-05-08 | Disposition: A | Discharge status: Home / Self Care | Payer: Medicare Other | Attending: Urology | Admitting: Urology

## 2022-05-08 ENCOUNTER — Ambulatory Visit
Admission: RE | Admit: 2022-05-08 | Discharge: 2022-05-08 | Disposition: A | Discharge status: Home / Self Care | Payer: Medicare Other | Source: Ambulatory Visit | Attending: Urology | Admitting: Urology

## 2022-05-08 ENCOUNTER — Encounter: Payer: Self-pay | Admitting: Urology

## 2022-05-08 VITALS — BP 136/89 | HR 70 | Ht 69.0 in | Wt 165.0 lb

## 2022-05-08 DIAGNOSIS — Z87442 Personal history of urinary calculi: Secondary | ICD-10-CM

## 2022-05-09 ENCOUNTER — Encounter: Payer: Self-pay | Admitting: Urology
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
## 2022-07-18 ENCOUNTER — Observation Stay
Admission: EM | Admit: 2022-07-18 | Discharge: 2022-07-19 | Disposition: A | Discharge status: Home / Self Care | Payer: Medicare Other | Attending: Surgery | Admitting: Surgery

## 2022-07-18 ENCOUNTER — Other Ambulatory Visit: Payer: Self-pay

## 2022-07-18 ENCOUNTER — Emergency Department: Payer: Medicare Other

## 2022-07-18 ENCOUNTER — Observation Stay: Payer: Medicare Other | Admitting: Registered Nurse

## 2022-07-18 ENCOUNTER — Encounter: Payer: Self-pay | Admitting: Emergency Medicine

## 2022-07-18 ENCOUNTER — Encounter
Admission: EM | Disposition: A | Discharge status: Home / Self Care | Payer: Self-pay | Source: Home / Self Care | Attending: Student in an Organized Health Care Education/Training Program

## 2022-07-18 DIAGNOSIS — I10 Essential (primary) hypertension: Secondary | ICD-10-CM | POA: Insufficient documentation

## 2022-07-18 DIAGNOSIS — Z7984 Long term (current) use of oral hypoglycemic drugs: Secondary | ICD-10-CM | POA: Insufficient documentation

## 2022-07-18 DIAGNOSIS — E119 Type 2 diabetes mellitus without complications: Secondary | ICD-10-CM | POA: Insufficient documentation

## 2022-07-18 DIAGNOSIS — K35891 Other acute appendicitis without perforation, with gangrene: Principal | ICD-10-CM

## 2022-07-18 DIAGNOSIS — Z7982 Long term (current) use of aspirin: Secondary | ICD-10-CM | POA: Insufficient documentation

## 2022-07-18 DIAGNOSIS — Z79899 Other long term (current) drug therapy: Secondary | ICD-10-CM | POA: Diagnosis not present

## 2022-07-18 DIAGNOSIS — K3580 Unspecified acute appendicitis: Secondary | ICD-10-CM

## 2022-07-18 DIAGNOSIS — R1031 Right lower quadrant pain: Secondary | ICD-10-CM | POA: Diagnosis present

## 2022-07-18 HISTORY — PX: LAPAROSCOPIC APPENDECTOMY: SHX408

## 2022-07-18 LAB — URINALYSIS, ROUTINE W REFLEX MICROSCOPIC
Bacteria, UA: NONE SEEN
Bilirubin Urine: NEGATIVE
Glucose, UA: 50 mg/dL — AB
Hgb urine dipstick: NEGATIVE
Ketones, ur: 5 mg/dL — AB
Leukocytes,Ua: NEGATIVE
Nitrite: NEGATIVE
Protein, ur: 30 mg/dL — AB
Specific Gravity, Urine: 1.029 (ref 1.005–1.030)
pH: 5 (ref 5.0–8.0)

## 2022-07-18 LAB — GLUCOSE, CAPILLARY
Glucose-Capillary: 123 mg/dL — ABNORMAL HIGH (ref 70–99)
Glucose-Capillary: 99 mg/dL (ref 70–99)
Glucose-Capillary: 140 mg/dL — ABNORMAL HIGH (ref 70–99)
Glucose-Capillary: 244 mg/dL — ABNORMAL HIGH (ref 70–99)

## 2022-07-18 LAB — COMPREHENSIVE METABOLIC PANEL
ALT: 24 U/L (ref 0–44)
AST: 28 U/L (ref 15–41)
Albumin: 4.1 g/dL (ref 3.5–5.0)
Alkaline Phosphatase: 39 U/L (ref 38–126)
Anion gap: 7 (ref 5–15)
BUN: 26 mg/dL — ABNORMAL HIGH (ref 8–23)
CO2: 26 mmol/L (ref 22–32)
Calcium: 9.5 mg/dL (ref 8.9–10.3)
Chloride: 102 mmol/L (ref 98–111)
Creatinine, Ser: 1.04 mg/dL (ref 0.61–1.24)
GFR, Estimated: >60 mL/min (ref >60)
Glucose, Bld: 172 mg/dL — ABNORMAL HIGH (ref 70–99)
Potassium: 4.1 mmol/L (ref 3.5–5.1)
Sodium: 135 mmol/L (ref 135–145)
Total Bilirubin: 1.7 mg/dL — ABNORMAL HIGH (ref 0.3–1.2)
Total Protein: 7.4 g/dL (ref 6.5–8.1)

## 2022-07-18 LAB — CBC
HCT: 41.4 % (ref 39.0–52.0)
Hemoglobin: 13.9 g/dL (ref 13.0–17.0)
MCH: 31 pg (ref 26.0–34.0)
MCHC: 33.6 g/dL (ref 30.0–36.0)
MCV: 92.2 fL (ref 80.0–100.0)
Platelets: 179 10*3/uL (ref 150–400)
RBC: 4.49 MIL/uL (ref 4.22–5.81)
RDW: 12.5 % (ref 11.5–15.5)
WBC: 13.2 10*3/uL — ABNORMAL HIGH (ref 4.0–10.5)
nRBC: 0 % (ref 0.0–0.2)

## 2022-07-18 LAB — SURGICAL PCR SCREEN
MRSA, PCR: NEGATIVE
Staphylococcus aureus: POSITIVE — AB

## 2022-07-18 LAB — LIPASE, BLOOD: Lipase: 39 U/L (ref 11–51)

## 2022-07-18 SURGERY — APPENDECTOMY, LAPAROSCOPIC
Anesthesia: General | Site: Abdomen

## 2022-07-18 MED ORDER — MIDAZOLAM HCL 2 MG/2ML IJ SOLN
INTRAMUSCULAR | Status: DC | PRN
  Administered 2022-07-18 (×2): 1 mg via INTRAVENOUS

## 2022-07-18 MED ORDER — DEXAMETHASONE SODIUM PHOSPHATE 10 MG/ML IJ SOLN
INTRAMUSCULAR | Status: DC | PRN
  Administered 2022-07-18: 10 mg via INTRAVENOUS

## 2022-07-18 MED ORDER — SUGAMMADEX SODIUM 200 MG/2ML IV SOLN
INTRAVENOUS | Status: DC | PRN
  Administered 2022-07-18: 200 mg via INTRAVENOUS

## 2022-07-18 MED ORDER — EPHEDRINE SULFATE (PRESSORS) 50 MG/ML IJ SOLN
INTRAMUSCULAR | Status: DC | PRN
  Administered 2022-07-18: 10 mg via INTRAVENOUS

## 2022-07-18 MED ORDER — ONDANSETRON HCL 4 MG/2ML IJ SOLN: 4.0000 mg | Freq: Once | INTRAMUSCULAR | Status: DC | PRN

## 2022-07-18 MED ORDER — PIPERACILLIN-TAZOBACTAM 3.375 G IVPB 30 MIN
3.3750 g | Freq: Once | INTRAVENOUS | Status: AC
  Administered 2022-07-18: 3.375 g via INTRAVENOUS
  Filled 2022-07-18: qty 50

## 2022-07-18 MED ORDER — OXYCODONE HCL 5 MG PO TABS: 5.0000 mg | ORAL_TABLET | ORAL | Status: DC | PRN

## 2022-07-18 MED ORDER — ATORVASTATIN CALCIUM 20 MG PO TABS
40.0000 mg | ORAL_TABLET | Freq: Every day | ORAL | Status: DC
  Administered 2022-07-18: 40 mg via ORAL
  Filled 2022-07-18: qty 2

## 2022-07-18 MED ORDER — PROPOFOL 10 MG/ML IV BOLUS
INTRAVENOUS | Status: AC
  Filled 2022-07-18: qty 20

## 2022-07-18 MED ORDER — POLYETHYLENE GLYCOL 3350 17 G PO PACK: 17.0000 g | PACK | Freq: Every day | ORAL | Status: DC | PRN

## 2022-07-18 MED ORDER — KETOROLAC TROMETHAMINE 30 MG/ML IJ SOLN
INTRAMUSCULAR | Status: DC | PRN
  Administered 2022-07-18: 15 mg via INTRAVENOUS

## 2022-07-18 MED ORDER — FENTANYL CITRATE (PF) 100 MCG/2ML IJ SOLN
INTRAMUSCULAR | Status: DC | PRN
  Administered 2022-07-18: 100 ug via INTRAVENOUS
  Administered 2022-07-18 (×2): 25 ug via INTRAVENOUS
  Administered 2022-07-18: 75 ug via INTRAVENOUS

## 2022-07-18 MED ORDER — ROCURONIUM BROMIDE 10 MG/ML (PF) SYRINGE
PREFILLED_SYRINGE | INTRAVENOUS | Status: AC
  Filled 2022-07-18: qty 10

## 2022-07-18 MED ORDER — LIDOCAINE HCL (PF) 2 % IJ SOLN
INTRAMUSCULAR | Status: AC
  Filled 2022-07-18: qty 5

## 2022-07-18 MED ORDER — ONDANSETRON HCL 4 MG/2ML IJ SOLN: 4.0000 mg | Freq: Four times a day (QID) | INTRAMUSCULAR | Status: DC | PRN

## 2022-07-18 MED ORDER — MIDAZOLAM HCL 2 MG/2ML IJ SOLN
INTRAMUSCULAR | Status: AC
  Filled 2022-07-18: qty 2

## 2022-07-18 MED ORDER — DOXAZOSIN MESYLATE 1 MG PO TABS
1.0000 mg | ORAL_TABLET | Freq: Every day | ORAL | Status: DC
  Administered 2022-07-19: 1 mg via ORAL
  Filled 2022-07-18: qty 1

## 2022-07-18 MED ORDER — KETAMINE HCL 50 MG/5ML IJ SOSY
PREFILLED_SYRINGE | INTRAMUSCULAR | Status: AC
  Filled 2022-07-18: qty 5

## 2022-07-18 MED ORDER — ONDANSETRON HCL 4 MG/2ML IJ SOLN
4.0000 mg | Freq: Once | INTRAMUSCULAR | Status: AC
  Administered 2022-07-18: 4 mg via INTRAVENOUS
  Filled 2022-07-18: qty 2

## 2022-07-18 MED ORDER — ORAL CARE MOUTH RINSE: 15.0000 mL | Freq: Once | OROMUCOSAL | Status: AC

## 2022-07-18 MED ORDER — 0.9 % SODIUM CHLORIDE (POUR BTL) OPTIME
TOPICAL | Status: DC | PRN
  Administered 2022-07-18: 500 mL

## 2022-07-18 MED ORDER — FINASTERIDE 5 MG PO TABS
5.0000 mg | ORAL_TABLET | Freq: Every day | ORAL | Status: DC
  Filled 2022-07-18: qty 1

## 2022-07-18 MED ORDER — SUCCINYLCHOLINE CHLORIDE 200 MG/10ML IV SOSY
PREFILLED_SYRINGE | INTRAVENOUS | Status: AC
  Filled 2022-07-18: qty 10

## 2022-07-18 MED ORDER — ACETAMINOPHEN 500 MG PO TABS
1000.0000 mg | ORAL_TABLET | Freq: Four times a day (QID) | ORAL | Status: DC
  Administered 2022-07-18 – 2022-07-19 (×4): 1000 mg via ORAL
  Filled 2022-07-18 (×4): qty 2

## 2022-07-18 MED ORDER — SODIUM CHLORIDE 0.9 % IV BOLUS
1000.0000 mL | Freq: Once | INTRAVENOUS | Status: AC
  Administered 2022-07-18: 1000 mL via INTRAVENOUS

## 2022-07-18 MED ORDER — DEXMEDETOMIDINE HCL IN NACL 80 MCG/20ML IV SOLN
INTRAVENOUS | Status: DC | PRN
  Administered 2022-07-18: 8 ug via BUCCAL

## 2022-07-18 MED ORDER — INSULIN ASPART 100 UNIT/ML IJ SOLN
0.0000 [IU] | INTRAMUSCULAR | Status: DC
  Administered 2022-07-18: 2 [IU] via SUBCUTANEOUS
  Administered 2022-07-18: 5 [IU] via SUBCUTANEOUS
  Administered 2022-07-19 (×3): 2 [IU] via SUBCUTANEOUS
  Filled 2022-07-18 (×5): qty 1

## 2022-07-18 MED ORDER — BUPIVACAINE-EPINEPHRINE (PF) 0.5% -1:200000 IJ SOLN
INTRAMUSCULAR | Status: DC | PRN
  Administered 2022-07-18: 30 mL via PERINEURAL

## 2022-07-18 MED ORDER — METOPROLOL TARTRATE 5 MG/5ML IV SOLN
INTRAVENOUS | Status: DC | PRN
  Administered 2022-07-18: 3 mg via INTRAVENOUS

## 2022-07-18 MED ORDER — CHLORHEXIDINE GLUCONATE 0.12 % MT SOLN
OROMUCOSAL | Status: AC
  Filled 2022-07-18: qty 15

## 2022-07-18 MED ORDER — ONDANSETRON 4 MG PO TBDP: 4.0000 mg | ORAL_TABLET | Freq: Four times a day (QID) | ORAL | Status: DC | PRN

## 2022-07-18 MED ORDER — SODIUM CHLORIDE 0.9 % IV SOLN: INTRAVENOUS | Status: DC | PRN

## 2022-07-18 MED ORDER — MORPHINE SULFATE (PF) 4 MG/ML IV SOLN
4.0000 mg | INTRAVENOUS | Status: DC | PRN
  Administered 2022-07-18: 4 mg via INTRAVENOUS
  Filled 2022-07-18: qty 1

## 2022-07-18 MED ORDER — PIPERACILLIN-TAZOBACTAM 3.375 G IVPB
3.3750 g | Freq: Three times a day (TID) | INTRAVENOUS | Status: DC
  Administered 2022-07-18 – 2022-07-19 (×2): 3.375 g via INTRAVENOUS
  Filled 2022-07-18 (×2): qty 50

## 2022-07-18 MED ORDER — KETOROLAC TROMETHAMINE 30 MG/ML IJ SOLN
INTRAMUSCULAR | Status: AC
  Filled 2022-07-18: qty 1

## 2022-07-18 MED ORDER — SODIUM CHLORIDE 0.9 % IV SOLN: INTRAVENOUS | Status: DC

## 2022-07-18 MED ORDER — PANTOPRAZOLE SODIUM 40 MG IV SOLR
40.0000 mg | Freq: Every day | INTRAVENOUS | Status: DC
  Administered 2022-07-18: 40 mg via INTRAVENOUS
  Filled 2022-07-18: qty 10

## 2022-07-18 MED ORDER — BUPIVACAINE-EPINEPHRINE (PF) 0.5% -1:200000 IJ SOLN
INTRAMUSCULAR | Status: AC
  Filled 2022-07-18: qty 30

## 2022-07-18 MED ORDER — CHLORHEXIDINE GLUCONATE 0.12 % MT SOLN
15.0000 mL | Freq: Once | OROMUCOSAL | Status: AC
  Administered 2022-07-18: 15 mL via OROMUCOSAL

## 2022-07-18 MED ORDER — TAMSULOSIN HCL 0.4 MG PO CAPS
0.4000 mg | ORAL_CAPSULE | Freq: Every day | ORAL | Status: DC
  Filled 2022-07-18: qty 1

## 2022-07-18 MED ORDER — LACTATED RINGERS IV SOLN: INTRAVENOUS | Status: DC

## 2022-07-18 MED ORDER — SUCCINYLCHOLINE CHLORIDE 200 MG/10ML IV SOSY
PREFILLED_SYRINGE | INTRAVENOUS | Status: DC | PRN
  Administered 2022-07-18: 80 mg via INTRAVENOUS

## 2022-07-18 MED ORDER — SODIUM CHLORIDE 0.9 % IR SOLN
Status: DC | PRN
  Administered 2022-07-18: 1000 mL

## 2022-07-18 MED ORDER — LIDOCAINE HCL (CARDIAC) PF 100 MG/5ML IV SOSY
PREFILLED_SYRINGE | INTRAVENOUS | Status: DC | PRN
  Administered 2022-07-18: 100 mg via INTRAVENOUS

## 2022-07-18 MED ORDER — FENTANYL CITRATE (PF) 100 MCG/2ML IJ SOLN
INTRAMUSCULAR | Status: AC
  Filled 2022-07-18: qty 2

## 2022-07-18 MED ORDER — ROCURONIUM BROMIDE 100 MG/10ML IV SOLN
INTRAVENOUS | Status: DC | PRN
  Administered 2022-07-18: 40 mg via INTRAVENOUS

## 2022-07-18 MED ORDER — ADULT MULTIVITAMIN W/MINERALS CH
1.0000 | ORAL_TABLET | Freq: Every day | ORAL | Status: DC
  Administered 2022-07-19: 1 via ORAL
  Filled 2022-07-18: qty 1

## 2022-07-18 MED ORDER — FENTANYL CITRATE (PF) 100 MCG/2ML IJ SOLN: 25.0000 ug | INTRAMUSCULAR | Status: DC | PRN

## 2022-07-18 MED ORDER — OXYCODONE HCL 5 MG PO TABS: 5.0000 mg | ORAL_TABLET | Freq: Once | ORAL | Status: DC | PRN

## 2022-07-18 MED ORDER — HYDROMORPHONE HCL 1 MG/ML IJ SOLN: 0.5000 mg | INTRAMUSCULAR | Status: DC | PRN

## 2022-07-18 MED ORDER — IOHEXOL 300 MG/ML  SOLN
100.0000 mL | Freq: Once | INTRAMUSCULAR | Status: AC | PRN
  Administered 2022-07-18: 100 mL via INTRAVENOUS

## 2022-07-18 MED ORDER — ONDANSETRON HCL 4 MG/2ML IJ SOLN
INTRAMUSCULAR | Status: DC | PRN
  Administered 2022-07-18: 4 mg via INTRAVENOUS

## 2022-07-18 MED ORDER — ONDANSETRON HCL 4 MG/2ML IJ SOLN
INTRAMUSCULAR | Status: AC
  Filled 2022-07-18: qty 2

## 2022-07-18 MED ORDER — AMLODIPINE BESYLATE 5 MG PO TABS
5.0000 mg | ORAL_TABLET | Freq: Every day | ORAL | Status: DC
  Administered 2022-07-19: 5 mg via ORAL
  Filled 2022-07-18: qty 1

## 2022-07-18 MED ORDER — OXYCODONE HCL 5 MG/5ML PO SOLN: 5.0000 mg | Freq: Once | ORAL | Status: DC | PRN

## 2022-07-18 MED ORDER — KETOROLAC TROMETHAMINE 30 MG/ML IJ SOLN
15.0000 mg | Freq: Four times a day (QID) | INTRAMUSCULAR | Status: DC
  Administered 2022-07-18 – 2022-07-19 (×3): 15 mg via INTRAVENOUS
  Filled 2022-07-18 (×3): qty 1

## 2022-07-18 MED ORDER — MUPIROCIN 2 % EX OINT
1.0000 | TOPICAL_OINTMENT | Freq: Two times a day (BID) | CUTANEOUS | Status: DC
  Administered 2022-07-18 – 2022-07-19 (×2): 1 via NASAL
  Filled 2022-07-18: qty 22

## 2022-07-18 MED ORDER — CHLORHEXIDINE GLUCONATE CLOTH 2 % EX PADS
6.0000 | MEDICATED_PAD | Freq: Every day | CUTANEOUS | Status: DC
  Administered 2022-07-19: 6 via TOPICAL

## 2022-07-18 MED ORDER — PROPOFOL 10 MG/ML IV BOLUS
INTRAVENOUS | Status: DC | PRN
  Administered 2022-07-18: 150 mg via INTRAVENOUS

## 2022-07-18 MED ORDER — EPHEDRINE 5 MG/ML INJ
INTRAVENOUS | Status: AC
  Filled 2022-07-18: qty 5

## 2022-07-18 SURGICAL SUPPLY — 44 items
CUTTER FLEX LINEAR 45M (STAPLE) IMPLANT
DERMABOND ADVANCED .7 DNX12 (GAUZE/BANDAGES/DRESSINGS) ×1 IMPLANT
ELECT CAUTERY BLADE TIP 2.5 (TIP)
ELECT REM PT RETURN 9FT ADLT (ELECTROSURGICAL) ×1
ELECTRODE CAUTERY BLDE TIP 2.5 (TIP) ×1 IMPLANT
ELECTRODE REM PT RTRN 9FT ADLT (ELECTROSURGICAL) ×1 IMPLANT
GLOVE SURG SYN 7.0 (GLOVE) ×1 IMPLANT
GLOVE SURG SYN 7.0 PF PI (GLOVE) ×1 IMPLANT
GLOVE SURG SYN 7.5  E (GLOVE) ×1
GLOVE SURG SYN 7.5 E (GLOVE) ×1 IMPLANT
GLOVE SURG SYN 7.5 PF PI (GLOVE) ×1 IMPLANT
GOWN STRL REUS W/ TWL LRG LVL3 (GOWN DISPOSABLE) ×2 IMPLANT
GOWN STRL REUS W/TWL LRG LVL3 (GOWN DISPOSABLE) ×2
IRRIGATION STRYKERFLOW (MISCELLANEOUS) IMPLANT
IRRIGATOR STRYKERFLOW (MISCELLANEOUS) ×1
IV NS 1000ML (IV SOLUTION) ×1
IV NS 1000ML BAXH (IV SOLUTION) ×1 IMPLANT
KIT TURNOVER KIT A (KITS) ×1 IMPLANT
LABEL OR SOLS (LABEL) ×1 IMPLANT
LIGASURE LAP MARYLAND 5MM 37CM (ELECTROSURGICAL) IMPLANT
MANIFOLD NEPTUNE II (INSTRUMENTS) ×1 IMPLANT
NEEDLE HYPO 22GX1.5 SAFETY (NEEDLE) ×1 IMPLANT
NS IRRIG 500ML POUR BTL (IV SOLUTION) ×1 IMPLANT
PACK LAP CHOLECYSTECTOMY (MISCELLANEOUS) ×1 IMPLANT
PENCIL SMOKE EVACUATOR (MISCELLANEOUS) IMPLANT
RELOAD 45 VASCULAR/THIN (ENDOMECHANICALS) IMPLANT
RELOAD STAPLE 45 2.5 WHT GRN (ENDOMECHANICALS) IMPLANT
RELOAD STAPLE 45 3.5 BLU ETS (ENDOMECHANICALS) ×1 IMPLANT
RELOAD STAPLE TA45 3.5 REG BLU (ENDOMECHANICALS) ×2 IMPLANT
SCISSORS METZENBAUM CVD 33 (INSTRUMENTS) ×1 IMPLANT
SLEEVE ADV FIXATION 5X100MM (TROCAR) ×1 IMPLANT
SUT MNCRL 4-0 (SUTURE) ×1
SUT MNCRL 4-0 27XMFL (SUTURE) ×1
SUT VICRYL 0 UR6 27IN ABS (SUTURE) ×1 IMPLANT
SUTURE MNCRL 4-0 27XMF (SUTURE) ×1 IMPLANT
SYS BAG RETRIEVAL 10MM (BASKET) ×1
SYS KII FIOS ACCESS ABD 5X100 (TROCAR) ×1
SYSTEM BAG RETRIEVAL 10MM (BASKET) ×1 IMPLANT
SYSTEM KII FIOS ACES ABD 5X100 (TROCAR) ×1 IMPLANT
TRAP FLUID SMOKE EVACUATOR (MISCELLANEOUS) ×1 IMPLANT
TRAY FOLEY MTR SLVR 16FR STAT (SET/KITS/TRAYS/PACK) ×1 IMPLANT
TROCAR BALLN GELPORT 12X130M (ENDOMECHANICALS) ×1 IMPLANT
TUBING EVAC SMOKE HEATED PNEUM (TUBING) ×1 IMPLANT
WATER STERILE IRR 500ML POUR (IV SOLUTION) ×1 IMPLANT

## 2022-07-18 NOTE — ED Provider Notes (Signed)
[facility] Provider Note    Event Date/Time   First MD Initiated Contact with Patient 07/18/22 1103     (approximate)   History   Abdominal Pain   HPI  Chung-Haw Brink is a 76 y.o. male no significant past abdominal history presents to the ER for the evaluation of right lower quadrant periumbilical pain that started over the past 24 hours.  Has had some nausea and vomiting.  Denies any chest pain no cough congestion.  No rash.  No dysuria.  No recent antibiotics.  No previous abdominal surgeries.     Physical Exam   Triage Vital Signs: ED Triage Vitals  Enc Vitals Group     BP 07/18/22 1015 (!) 109/55     Pulse Rate 07/18/22 1014 (!) 58     Resp 07/18/22 1014 16     Temp 07/18/22 1014 98.1 F (36.7 C)     Temp Source 07/18/22 1014 Oral     SpO2 07/18/22 1014 96 %     Weight --      Height --      Head Circumference --      Peak Flow --      Pain Score 07/18/22 1015 10     Pain Loc --      Pain Edu? --      Excl. in GC? --     Most recent vital signs: Vitals:   07/18/22 1014 07/18/22 1015  BP:  (!) 109/55  Pulse: (!) 58   Resp: 16   Temp: 98.1 F (36.7 C)   SpO2: 96%      Constitutional: Alert  Eyes: Conjunctivae are normal.  Head: Atraumatic. Nose: No congestion/rhinnorhea. Mouth/Throat: Mucous membranes are moist.   Neck: Painless ROM.  Cardiovascular:   Good peripheral circulation. Respiratory: Normal respiratory effort.  No retractions.  Gastrointestinal: Soft with mild tenderness palpation in the right lower quadrant.  No guarding or rebound. Musculoskeletal:  no deformity Neurologic:  MAE spontaneously. No gross focal neurologic deficits are appreciated.  Skin:  Skin is warm, dry and intact. No rash noted. Psychiatric: Mood and affect are normal. Speech and behavior are normal.    ED Results / Procedures / Treatments   Labs (all labs ordered are listed, but only abnormal results are displayed) Labs Reviewed   COMPREHENSIVE METABOLIC PANEL - Abnormal; Notable for the following components:      Result Value   Glucose, Bld 172 (*)    BUN 26 (*)    Total Bilirubin 1.7 (*)    All other components within normal limits  CBC - Abnormal; Notable for the following components:   WBC 13.2 (*)    All other components within normal limits  URINALYSIS, ROUTINE W REFLEX MICROSCOPIC - Abnormal; Notable for the following components:   Color, Urine AMBER (*)    APPearance CLOUDY (*)    Glucose, UA 50 (*)    Ketones, ur 5 (*)    Protein, ur 30 (*)    All other components within normal limits  LIPASE, BLOOD     EKG  ED ECG REPORT I, Willy Eddy, the attending physician, personally viewed and interpreted this ECG.   Date: 07/18/2022  EKG Time: 10:17  Rate: 95  Rhythm: sinus  Axis: normal  Intervals: normal  ST&T Change: no stemi, no depressions    RADIOLOGY Please see ED Course for my review and interpretation.  I personally reviewed all radiographic images ordered to evaluate for the above  acute complaints and reviewed radiology reports and findings.  These findings were personally discussed with the patient.  Please see medical record for radiology report.    PROCEDURES:  Critical Care performed: No  Procedures   MEDICATIONS ORDERED IN ED: Medications  piperacillin-tazobactam (ZOSYN) IVPB 3.375 g (3.375 g Intravenous New Bag/Given 07/18/22 1245)  lactated ringers infusion (has no administration in time range)  acetaminophen (TYLENOL) tablet 1,000 mg (has no administration in time range)  HYDROmorphone (DILAUDID) injection 0.5 mg (has no administration in time range)  polyethylene glycol (MIRALAX / GLYCOLAX) packet 17 g (has no administration in time range)  ondansetron (ZOFRAN-ODT) disintegrating tablet 4 mg (has no administration in time range)    Or  ondansetron (ZOFRAN) injection 4 mg (has no administration in time range)  pantoprazole (PROTONIX) injection 40 mg (has no  administration in time range)  piperacillin-tazobactam (ZOSYN) IVPB 3.375 g (has no administration in time range)  oxyCODONE (Oxy IR/ROXICODONE) immediate release tablet 5-10 mg (has no administration in time range)  amLODipine (NORVASC) tablet 5 mg (has no administration in time range)  atorvastatin (LIPITOR) tablet 40 mg (has no administration in time range)  beta carotene w/minerals (OCUVITE) tablet 1 tablet (has no administration in time range)  doxazosin (CARDURA) tablet 1 mg (has no administration in time range)  finasteride (PROSCAR) tablet 5 mg (has no administration in time range)  tamsulosin (FLOMAX) capsule 0.4 mg (has no administration in time range)  insulin aspart (novoLOG) injection 0-15 Units (has no administration in time range)  ondansetron (ZOFRAN) injection 4 mg (4 mg Intravenous Given 07/18/22 1128)  sodium chloride 0.9 % bolus 1,000 mL (1,000 mLs Intravenous New Bag/Given 07/18/22 1128)  iohexol (OMNIPAQUE) 300 MG/ML solution 100 mL (100 mLs Intravenous Contrast Given 07/18/22 1156)     IMPRESSION / MDM / ASSESSMENT AND PLAN / ED COURSE  I reviewed the triage vital signs and the nursing notes.                              Differential diagnosis includes, but is not limited to, colitis, diverticulitis, appendicitis, biliary pathology, UTI, stone  Patient presenting to the ER for evaluation of symptoms as described above.  Based on symptoms, risk factors and considered above differential, this presenting complaint could reflect a potentially life-threatening illness therefore the patient will be placed on continuous pulse oximetry and telemetry for monitoring.  Laboratory evaluation will be sent to evaluate for the above complaints.    Clinical Course as of 07/18/22 1309  Fri Jul 18, 2022  1217 CT imaging on my review and patrician is consistent with acute appendicitis with appendicolith. [PR]  1222 Case discussed in consultation with Dr. Aleen Campi of general surgery who  agrees to admit patient and take him to the OR for management.  IV antibiotics have been ordered.  Patient reassessed remains nontoxic appearing no peritonitis on exam.  Feels like his pain is improving.  [PR]    Clinical Course User Index [PR] Willy Eddy, MD     FINAL CLINICAL IMPRESSION(S) / ED DIAGNOSES   Final diagnoses:  Acute appendicitis, unspecified acute appendicitis type     Rx / DC Orders   ED Discharge Orders     None        Note:  This document was prepared using Dragon voice recognition software and may include unintentional dictation errors.    Willy Eddy, MD 07/18/22 1309
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
## 2022-07-18 NOTE — Op Note (Signed)
  Procedure Date:  07/18/2022  Pre-operative Diagnosis:  Acute appendicitis  Post-operative Diagnosis: Acute gangrenous appendicitis  Procedure:  Laparoscopic appendectomy  Surgeon:  Howie Ill, MD  Anesthesia:  General endotracheal  Estimated Blood Loss:  10 ml  Specimens:  appendix  Complications:  None  Indications for Procedure:  This is a 76 y.o. male who presents with abdominal pain and workup revealing acute appendicitis.  The options of surgery versus observation were reviewed with the patient and/or family. The risks of bleeding, infection, recurrence of symptoms, negative laparoscopy, potential for an open procedure, bowel injury, abscess or infection, were all discussed with the patient and he was willing to proceed.  Description of Procedure: The patient was correctly identified in the preoperative area and brought into the operating room.  The patient was placed supine with VTE prophylaxis in place.  Appropriate time-outs were performed.  Anesthesia was induced and the patient was intubated.  Foley catheter was placed.  Appropriate antibiotics were infused.  The abdomen was prepped and draped in a sterile fashion. An infraumbilical incision was made. A cutdown technique was used to enter the abdominal cavity without injury, and a Hasson trocar was inserted.  Pneumoperitoneum was obtained with appropriate opening pressures.  Two 5-mm ports were placed in the suprapubic and left lateral positions under direct visualization.  The right lower quadrant was inspected and the appendix was identified and found to be acutely inflamed with gangrenous wall, in retrocecal location.  The appendix was carefully dissected using LigaSure.  The mesoappendix was divided using the LigaSure.  The base of the appendix was dissected out and divided with a standard load Endo GIA.  The appendix was placed in an Endocatch bag.  There was mild oozing from the staple line which was controlled with  cautery.  The right lower quadrant was then inspected again revealing an intact staple line, no bleeding, and no bowel injury.  The area was thoroughly irrigated.  The 5 mm ports were removed under direct visualization and the Hasson trocar was removed.  The Endocatch bag was brought out through the umbilical incision.  The fascial opening was closed using 0 vicryl suture.  Local anesthetic was infused in all incisions and the incisions were closed with 4-0 Monocryl.  The wounds were cleaned and sealed with DermaBond.  Foley catheter was removed and the patient was emerged from anesthesia and extubated and brought to the recovery room for further management.  The patient tolerated the procedure well and all counts were correct at the end of the case.   Howie Ill, MD

## 2022-07-18 NOTE — ED Triage Notes (Signed)
Pt here from [facility], [place] with lower abd pain x2 days. Pt was not able to have a bowel movement until taking some laxatives but was able to have a small one yesterday. Pt states pain is getting stronger. Pt states he is getting weaker as well.

## 2022-07-18 NOTE — Transfer of Care (Signed)
Immediate Anesthesia Transfer of Care Note  Patient: Jacob Knox  Procedure(s) Performed: APPENDECTOMY LAPAROSCOPIC (Abdomen)  Patient Location: PACU  Anesthesia Type:General  Level of Consciousness: awake, patient cooperative, and confused  Airway & Oxygen Therapy: Patient Spontanous Breathing and Patient connected to face mask oxygen  Post-op Assessment: Report given to RN and Post -op Vital signs reviewed and stable  Post vital signs: Reviewed and stable  Last Vitals:  Vitals Value Taken Time  BP 160/90 07/18/22 1940  Temp    Pulse 106 07/18/22 1942  Resp 13 07/18/22 1942  SpO2 96 % 07/18/22 1942  Vitals shown include unvalidated device data.  Last Pain:  Vitals:   07/18/22 1724  TempSrc: Oral  PainSc: 0-No pain         Complications: No notable events documented.

## 2022-07-18 NOTE — Anesthesia Procedure Notes (Signed)
Procedure Name: Intubation Date/Time: 07/18/2022 5:56 PM  Performed by: Hezzie Bump, CRNAPre-anesthesia Checklist: Patient identified, Patient being monitored, Timeout performed, Emergency Drugs available and Suction available Patient Re-evaluated:Patient Re-evaluated prior to induction Oxygen Delivery Method: Circle system utilized Preoxygenation: Pre-oxygenation with 100% oxygen Induction Type: IV induction Laryngoscope Size: McGraph and 4 Grade View: Grade III Tube type: Oral Tube size: 7.0 mm Number of attempts: 2 Airway Equipment and Method: Stylet and Video-laryngoscopy Placement Confirmation: ETT inserted through vocal cords under direct vision, positive ETCO2 and breath sounds checked- equal and bilateral Secured at: 21 cm Tube secured with: Tape Dental Injury: Teeth and Oropharynx as per pre-operative assessment

## 2022-07-18 NOTE — ED Notes (Signed)
See triage note  Presents with pain to right side of abd   States pain started couple of days ago  Became worse  Was sent to ED by PCP  afebrile on arrival
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
## 2022-07-19 ENCOUNTER — Encounter: Payer: Self-pay | Admitting: Surgery

## 2022-07-19 DIAGNOSIS — K35891 Other acute appendicitis without perforation, with gangrene: Secondary | ICD-10-CM | POA: Diagnosis not present

## 2022-07-19 LAB — GLUCOSE, CAPILLARY
Glucose-Capillary: 127 mg/dL — ABNORMAL HIGH (ref 70–99)
Glucose-Capillary: 133 mg/dL — ABNORMAL HIGH (ref 70–99)
Glucose-Capillary: 145 mg/dL — ABNORMAL HIGH (ref 70–99)

## 2022-07-19 MED ORDER — IBUPROFEN 600 MG PO TABS: 600.0000 mg | ORAL_TABLET | Freq: Three times a day (TID) | ORAL | 1 refills | Status: AC | PRN

## 2022-07-19 MED ORDER — AMOXICILLIN-POT CLAVULANATE 875-125 MG PO TABS: 1.0000 | ORAL_TABLET | Freq: Two times a day (BID) | ORAL | 0 refills | Status: DC

## 2022-07-19 MED ORDER — ACETAMINOPHEN 500 MG PO TABS: 1000.0000 mg | ORAL_TABLET | Freq: Four times a day (QID) | ORAL | Status: AC | PRN

## 2022-07-19 MED ORDER — OXYCODONE HCL 5 MG PO TABS: 5.0000 mg | ORAL_TABLET | ORAL | 0 refills | Status: DC | PRN

## 2022-07-19 NOTE — Discharge Summary (Signed)
Patient ID: Jacob Knox MRN: [id number] DOB/AGE: [birth year] 76 y.o.  Admit date: 07/18/2022 Discharge date: 07/19/2022   Discharge Diagnoses:  Principal Problem:   Acute gangrenous appendicitis   Procedures:  Laparoscopic appendectomy  Hospital Course: Patient was admitted on 07/18/22 with acute appendicitis.  He was taken to the OR same day and had a laparoscopic appendectomy.  Appendix was found to be gangrenous, but not perforated.  Otherwise uneventful surgery.  Post-operatively, his diet was advanced, his pain was well controlled, was ambulating, and tolerating a diet.  On exam, his abdomen is soft, non-distended, appropriately tender.  Incisions are clean, dry, intact.  He will be discharged today with 7 day course of Augmentin and f/u in 2 weeks.  Consults: None  Disposition: Discharge disposition: 01-Home or Self Care       Discharge Instructions     Call MD for:  difficulty breathing, headache or visual disturbances   Complete by: As directed    Call MD for:  persistant nausea and vomiting   Complete by: As directed    Call MD for:  redness, tenderness, or signs of infection (pain, swelling, redness, odor or green/yellow discharge around incision site)   Complete by: As directed    Call MD for:  severe uncontrolled pain   Complete by: As directed    Call MD for:  temperature >100.4   Complete by: As directed    Diet - low sodium heart healthy   Complete by: As directed    Discharge instructions   Complete by: As directed    1.  Patient may shower, but do not scrub wounds heavily and dab dry only. 2.  Do not submerge wounds in pool/tub until fully healed. 3.  Do not apply ointments or hydrogen peroxide to the wounds. 4.  May apply ice packs to the wounds for comfort. 5.   Driving Restrictions   Complete by: As directed    Do not drive while taking narcotics for pain control.  Prior to driving, make sure you are able to rotate right and left to look at  blindspots without significant pain or discomfort.   Increase activity slowly   Complete by: As directed    Lifting restrictions   Complete by: As directed    No heavy lifting or pushing of more than 10-15 lbs for 4 weeks.   No dressing needed   Complete by: As directed       Allergies as of 07/19/2022       Reactions   Aspirin Other (See Comments)   Tingling in throat but can tolerate baby aspirin (81 mg)   Kiwi Extract Itching   Plavix [clopidogrel Bisulfate] Hives        Medication List     TAKE these medications    acetaminophen 500 MG tablet Commonly known as: TYLENOL Take 2 tablets (1,000 mg total) by mouth every 6 (six) hours as needed for mild pain.   amLODipine 5 MG tablet Commonly known as: NORVASC Take 5 mg by mouth daily.   amoxicillin-clavulanate 875-125 MG tablet Commonly known as: AUGMENTIN Take 1 tablet by mouth 2 (two) times daily.   ascorbic acid 500 MG tablet Commonly known as: VITAMIN C Take 500 mg by mouth daily.   aspirin 81 MG tablet Take 81 mg by mouth daily.   atorvastatin 40 MG tablet Commonly known as: LIPITOR Take 40 mg by mouth daily.   b complex vitamins capsule Take 1 capsule by mouth daily.   beta  carotene w/minerals tablet Take 1 tablet by mouth daily.   CALCIUM 500 PO Take 500 mg by mouth in the morning, at noon, and at bedtime.   Cholecalciferol 50 MCG (2000 UT) Tabs Take 2,000 Units by mouth daily.   doxazosin 1 MG tablet Commonly known as: CARDURA Take 1 mg by mouth daily.   finasteride 5 MG tablet Commonly known as: PROSCAR Take 5 mg by mouth daily.   Fish Oil 500 MG Caps Take 500 mg by mouth in the morning and at bedtime.   Garlic 1000 MG Caps Take 1,000 mg by mouth daily.   guanFACINE 2 MG tablet Commonly known as: TENEX Take 2 mg by mouth every morning.   ibuprofen 600 MG tablet Commonly known as: ADVIL Take 1 tablet (600 mg total) by mouth every 8 (eight) hours as needed for moderate pain.    metFORMIN 500 MG 24 hr tablet Commonly known as: GLUCOPHAGE-XR Take 250 mg by mouth in the morning and at bedtime.   multivitamin tablet Take 1 tablet by mouth daily.   oxyCODONE 5 MG immediate release tablet Commonly known as: Oxy IR/ROXICODONE Take 1 tablet (5 mg total) by mouth every 4 (four) hours as needed for severe pain.   tamsulosin 0.4 MG Caps capsule Commonly known as: FLOMAX Take 1 capsule (0.4 mg total) by mouth daily.               Discharge Care Instructions  (From admission, onward)           Start     Ordered   07/19/22 0000  No dressing needed        07/19/22 1236            Follow-up Information     Donovan Kail, PA-C Follow up in 2 week(s).   Specialty: Physician Assistant Contact information: [address] [place] [address] [phone]

## 2022-07-19 NOTE — Anesthesia Postprocedure Evaluation (Signed)
Anesthesia Post Note  Patient: Jacob Knox  Procedure(s) Performed: APPENDECTOMY LAPAROSCOPIC (Abdomen)  Patient location during evaluation: PACU Anesthesia Type: General Level of consciousness: awake and alert Pain management: pain level controlled Vital Signs Assessment: post-procedure vital signs reviewed and stable Respiratory status: spontaneous breathing, nonlabored ventilation, respiratory function stable and patient connected to nasal cannula oxygen Cardiovascular status: blood pressure returned to baseline and stable Postop Assessment: no apparent nausea or vomiting Anesthetic complications: no   No notable events documented.   Last Vitals:  Vitals:   07/18/22 2219 07/19/22 0413  BP: 138/65 102/64  Pulse: 97 98  Resp: 16 16  Temp: 37.1 C 36.7 C  SpO2: 100% 96%    Last Pain:  Vitals:   07/19/22 0248  TempSrc:   PainSc: Asleep                 Corinda Gubler

## 2022-07-22 LAB — SURGICAL PATHOLOGY

## 2022-08-05 ENCOUNTER — Encounter: Payer: Self-pay | Admitting: Physician Assistant

## 2022-08-05 ENCOUNTER — Ambulatory Visit (INDEPENDENT_AMBULATORY_CARE_PROVIDER_SITE_OTHER): Payer: Medicare Other | Admitting: Physician Assistant

## 2022-08-05 ENCOUNTER — Other Ambulatory Visit: Payer: Self-pay

## 2022-08-05 VITALS — BP 159/77 | HR 71 | Temp 97.6°F | Ht 69.5 in | Wt 167.0 lb

## 2022-08-05 DIAGNOSIS — K353 Acute appendicitis with localized peritonitis, without perforation or gangrene: Secondary | ICD-10-CM

## 2022-08-05 DIAGNOSIS — K35891 Other acute appendicitis without perforation, with gangrene: Secondary | ICD-10-CM

## 2022-08-05 DIAGNOSIS — Z09 Encounter for follow-up examination after completed treatment for conditions other than malignant neoplasm: Secondary | ICD-10-CM

## 2022-08-05 NOTE — Patient Instructions (Signed)

## 2022-08-05 NOTE — Progress Notes (Signed)
[facility] POST-OP OFFICE VISIT  08/05/2022  HPI: Jacob Knox is a 76 y.o. male 18 days s/p laparoscopic appendectomy for acute appendicitis with Dr Aleen Campi   He is doing very well Never had any pain after surgery No fever, chills, nausea, emesis Appetite is good; normal bowel function No issues with incisions  Vital signs: BP (!) 159/77   Pulse 71   Temp 97.6 F (36.4 C) (Oral)   Ht 5' 9.5" (1.765 m)   Wt 167 lb (75.8 kg)   SpO2 97%   BMI 24.31 kg/m    Physical Exam: Constitutional: Well appearing male, NAD Abdomen: Soft, non-tender, non-distended, no rebound/guarding Skin: Laparoscopic incisions are healing well, no erythema or drainage   Assessment/Plan: This is a 76 y.o. male 18 days s/p laparoscopic appendectomy for acute appendicitis with Dr Aleen Campi    - Pain control prn  - Reviewed wound care recommendation  - Reviewed lifting restrictions; 4 weeks total  - Reviewed surgical pathology; Appendicitis   - He can follow up on as needed basis; he understands to call with questions/concerns  -- Lynden Oxford, PA-C [facility] 08/05/2022, 2:29 PM M-F: 7am - 4pm

## 2023-04-23 ENCOUNTER — Other Ambulatory Visit: Payer: Self-pay | Admitting: *Deleted

## 2023-04-23 DIAGNOSIS — R109 Unspecified abdominal pain: Secondary | ICD-10-CM

## 2023-05-08 ENCOUNTER — Ambulatory Visit: Payer: Medicare Other | Admitting: Urology

## 2023-05-21 ENCOUNTER — Ambulatory Visit: Payer: Medicare Other | Admitting: Urology

## 2023-06-04 ENCOUNTER — Encounter: Payer: Self-pay | Admitting: Urology

## 2023-06-04 ENCOUNTER — Ambulatory Visit
Admission: RE | Admit: 2023-06-04 | Discharge: 2023-06-04 | Disposition: A | Discharge status: Home / Self Care | Payer: Medicare Other | Source: Ambulatory Visit | Attending: Urology | Admitting: Urology

## 2023-06-04 ENCOUNTER — Ambulatory Visit (INDEPENDENT_AMBULATORY_CARE_PROVIDER_SITE_OTHER): Payer: Medicare Other | Admitting: Urology

## 2023-06-04 VITALS — BP 110/76 | HR 82 | Ht 72.0 in | Wt 167.0 lb

## 2023-06-04 DIAGNOSIS — R109 Unspecified abdominal pain: Secondary | ICD-10-CM | POA: Insufficient documentation

## 2023-06-04 DIAGNOSIS — Z87442 Personal history of urinary calculi: Secondary | ICD-10-CM | POA: Insufficient documentation

## 2023-06-04 DIAGNOSIS — N200 Calculus of kidney: Secondary | ICD-10-CM | POA: Diagnosis not present
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
## 2023-06-05 ENCOUNTER — Encounter: Payer: Self-pay | Admitting: Urology

## 2023-06-05 LAB — URINALYSIS, COMPLETE
Bilirubin, UA: NEGATIVE
Glucose, UA: NEGATIVE
Leukocytes,UA: NEGATIVE
Nitrite, UA: NEGATIVE
RBC, UA: NEGATIVE
Specific Gravity, UA: 1.025 (ref 1.005–1.030)
Urobilinogen, Ur: 0.2 mg/dL (ref 0.2–1.0)
pH, UA: 5.5 (ref 5.0–7.5)

## 2023-06-05 LAB — MICROSCOPIC EXAMINATION: Bacteria, UA: NONE SEEN

## 2023-12-03 ENCOUNTER — Encounter: Payer: Self-pay | Admitting: Internal Medicine

## 2023-12-08 ENCOUNTER — Encounter: Payer: Self-pay | Admitting: Internal Medicine

## 2023-12-08 ENCOUNTER — Other Ambulatory Visit: Payer: Self-pay

## 2023-12-14 ENCOUNTER — Ambulatory Visit: Admit: 2023-12-14 | Payer: Medicare Other | Admitting: Gastroenterology

## 2023-12-14 ENCOUNTER — Encounter: Payer: Self-pay | Admitting: Internal Medicine

## 2023-12-14 SURGERY — ESOPHAGOGASTRODUODENOSCOPY (EGD) WITH PROPOFOL
Anesthesia: General

## 2023-12-15 ENCOUNTER — Ambulatory Visit
Admission: RE | Admit: 2023-12-15 | Discharge: 2023-12-15 | Disposition: A | Discharge status: Home / Self Care | Payer: Medicare Other | Attending: Internal Medicine | Admitting: Internal Medicine

## 2023-12-15 ENCOUNTER — Encounter
Admission: RE | Disposition: A | Discharge status: Home / Self Care | Payer: Self-pay | Source: Home / Self Care | Attending: Internal Medicine

## 2023-12-15 ENCOUNTER — Ambulatory Visit: Admitting: Anesthesiology

## 2023-12-15 ENCOUNTER — Encounter: Payer: Self-pay | Admitting: Internal Medicine

## 2023-12-15 ENCOUNTER — Other Ambulatory Visit: Payer: Self-pay

## 2023-12-15 DIAGNOSIS — I10 Essential (primary) hypertension: Secondary | ICD-10-CM | POA: Insufficient documentation

## 2023-12-15 DIAGNOSIS — K2970 Gastritis, unspecified, without bleeding: Secondary | ICD-10-CM | POA: Insufficient documentation

## 2023-12-15 DIAGNOSIS — E119 Type 2 diabetes mellitus without complications: Secondary | ICD-10-CM | POA: Insufficient documentation

## 2023-12-15 DIAGNOSIS — I2510 Atherosclerotic heart disease of native coronary artery without angina pectoris: Secondary | ICD-10-CM | POA: Insufficient documentation

## 2023-12-15 DIAGNOSIS — M069 Rheumatoid arthritis, unspecified: Secondary | ICD-10-CM | POA: Diagnosis not present

## 2023-12-15 DIAGNOSIS — K2100 Gastro-esophageal reflux disease with esophagitis, without bleeding: Secondary | ICD-10-CM | POA: Insufficient documentation

## 2023-12-15 DIAGNOSIS — N400 Enlarged prostate without lower urinary tract symptoms: Secondary | ICD-10-CM | POA: Insufficient documentation

## 2023-12-15 DIAGNOSIS — Z955 Presence of coronary angioplasty implant and graft: Secondary | ICD-10-CM | POA: Diagnosis not present

## 2023-12-15 DIAGNOSIS — Z7984 Long term (current) use of oral hypoglycemic drugs: Secondary | ICD-10-CM | POA: Diagnosis not present

## 2023-12-15 DIAGNOSIS — R0789 Other chest pain: Secondary | ICD-10-CM | POA: Diagnosis present

## 2023-12-15 DIAGNOSIS — Z87891 Personal history of nicotine dependence: Secondary | ICD-10-CM | POA: Insufficient documentation

## 2023-12-15 DIAGNOSIS — R12 Heartburn: Secondary | ICD-10-CM | POA: Diagnosis present

## 2023-12-15 HISTORY — DX: Benign prostatic hyperplasia without lower urinary tract symptoms: N40.0

## 2023-12-15 HISTORY — DX: Nondisplaced fracture of distal phalanx of right thumb, initial encounter for closed fracture: S62.524A

## 2023-12-15 HISTORY — DX: Atherosclerotic heart disease of native coronary artery without angina pectoris: I25.10

## 2023-12-15 LAB — GLUCOSE, CAPILLARY: Glucose-Capillary: 120 mg/dL — ABNORMAL HIGH (ref 70–99)

## 2023-12-15 SURGERY — ESOPHAGOGASTRODUODENOSCOPY (EGD) WITH PROPOFOL
Anesthesia: General

## 2023-12-15 MED ORDER — LIDOCAINE HCL (CARDIAC) PF 100 MG/5ML IV SOSY
PREFILLED_SYRINGE | INTRAVENOUS | Status: DC | PRN
  Administered 2023-12-15: 70 mg via INTRAVENOUS

## 2023-12-15 MED ORDER — GLYCOPYRROLATE 0.2 MG/ML IJ SOLN
INTRAMUSCULAR | Status: AC
  Filled 2023-12-15: qty 1

## 2023-12-15 MED ORDER — PROPOFOL 10 MG/ML IV BOLUS
INTRAVENOUS | Status: DC | PRN
  Administered 2023-12-15: 50 mg via INTRAVENOUS
  Administered 2023-12-15: 30 mg via INTRAVENOUS

## 2023-12-15 MED ORDER — PROPOFOL 500 MG/50ML IV EMUL
INTRAVENOUS | Status: DC | PRN
  Administered 2023-12-15: 75 ug/kg/min via INTRAVENOUS

## 2023-12-15 MED ORDER — PROPOFOL 1000 MG/100ML IV EMUL
INTRAVENOUS | Status: AC
  Filled 2023-12-15: qty 100

## 2023-12-15 MED ORDER — SODIUM CHLORIDE 0.9 % IV SOLN: INTRAVENOUS | Status: DC

## 2023-12-15 MED ORDER — LIDOCAINE HCL (PF) 2 % IJ SOLN
INTRAMUSCULAR | Status: AC
  Filled 2023-12-15: qty 5

## 2023-12-15 MED ORDER — GLYCOPYRROLATE 0.2 MG/ML IJ SOLN
INTRAMUSCULAR | Status: DC | PRN
  Administered 2023-12-15: .2 mg via INTRAVENOUS

## 2023-12-15 MED ORDER — DEXMEDETOMIDINE HCL IN NACL 80 MCG/20ML IV SOLN
INTRAVENOUS | Status: AC
  Filled 2023-12-15: qty 20

## 2023-12-15 NOTE — Interval H&P Note (Signed)
 History and Physical Interval Note:  12/15/2023 9:21 AM  Jacob Knox  has presented today for surgery, with the diagnosis of R12 (ICD-10-CM) - Heartburn.  The various methods of treatment have been discussed with the patient and family. After consideration of risks, benefits and other options for treatment, the patient has consented to  Procedure(s) with comments: ESOPHAGOGASTRODUODENOSCOPY (EGD) WITH PROPOFOL (N/A) - DM as a surgical intervention.  The patient's history has been reviewed, patient examined, no change in status, stable for surgery.  I have reviewed the patient's chart and labs.  Questions were answered to the patient's satisfaction.     [place], [place]

## 2023-12-15 NOTE — Anesthesia Postprocedure Evaluation (Signed)
 Anesthesia Post Note  Patient: Jacob Knox  Procedure(s) Performed: ESOPHAGOGASTRODUODENOSCOPY (EGD) WITH PROPOFOL  Patient location during evaluation: Endoscopy Anesthesia Type: General Level of consciousness: awake and alert Pain management: pain level controlled Vital Signs Assessment: post-procedure vital signs reviewed and stable Respiratory status: spontaneous breathing, nonlabored ventilation, respiratory function stable and patient connected to nasal cannula oxygen Cardiovascular status: blood pressure returned to baseline and stable Postop Assessment: no apparent nausea or vomiting Anesthetic complications: no   No notable events documented.   Last Vitals:  Vitals:   12/15/23 1002 12/15/23 1012  BP: 134/71 133/82  Pulse: 63 (!) 59  Resp: 18 18  Temp:    SpO2: 97% 97%    Last Pain:  Vitals:   12/15/23 0942  TempSrc: Temporal  PainSc: Asleep                 Cleda Mccreedy Srinivas Lippman

## 2023-12-15 NOTE — Transfer of Care (Signed)
 Immediate Anesthesia Transfer of Care Note  Patient: Jacob Knox  Procedure(s) Performed: ESOPHAGOGASTRODUODENOSCOPY (EGD) WITH PROPOFOL  Patient Location: PACU  Anesthesia Type:General  Level of Consciousness: sedated  Airway & Oxygen Therapy: Patient Spontanous Breathing  Post-op Assessment: Report given to RN and Post -op Vital signs reviewed and stable  Post vital signs: Reviewed and stable  Last Vitals:  Vitals Value Taken Time  BP 138/81 12/15/23 0943  Temp 35.8 C 12/15/23 0942  Pulse 62 12/15/23 0943  Resp 16 12/15/23 0943  SpO2 97 % 12/15/23 0943  Vitals shown include unfiled device data.  Last Pain:  Vitals:   12/15/23 0942  TempSrc: Temporal  PainSc: Asleep         Complications: No notable events documented.

## 2023-12-15 NOTE — Op Note (Signed)
 [facility] Gastroenterology Patient Name: Jacob Knox Procedure Date: 12/15/2023 9:18 AM MRN: [id number] Account #: [id number] Date of Birth: 1946/01/14 Admit Type: Outpatient Age: 78 Room: [facility] ENDO ROOM 1 Gender: Male Note Status: Finalized Instrument Name: Upper Endoscope [id number] Procedure:             Upper GI endoscopy Indications:           Heartburn, Chest pain (non cardiac) Providers:             Boykin Nearing. Leylany Nored MD, MD Medicines:             Propofol per Anesthesia Complications:         No immediate complications. Estimated blood loss:                         Minimal. Procedure:             Pre-Anesthesia Assessment:                        - The risks and benefits of the procedure and the                         sedation options and risks were discussed with the                         patient. All questions were answered and informed                         consent was obtained.                        - Patient identification and proposed procedure were                         verified prior to the procedure by the nurse. The                         procedure was verified in the procedure room.                        - ASA Grade Assessment: III - A patient with severe                         systemic disease.                        - After reviewing the risks and benefits, the patient                         was deemed in satisfactory condition to undergo the                         procedure.                        After obtaining informed consent, the endoscope was                         passed under direct vision. Throughout the procedure,  the patient's blood pressure, pulse, and oxygen                         saturations were monitored continuously. The Endoscope                         was introduced through the mouth, and advanced to the                         third part of duodenum. The upper GI endoscopy was                          accomplished without difficulty. The patient tolerated                         the procedure well. Findings:      Normal mucosa was found in the upper third of the esophagus and in the       middle third of the esophagus. Four biopsies were obtained with cold       forceps for evaluation of eosinophilic esophagitis in the upper third of       the esophagus. Estimated blood loss was minimal.      LA Grade B (one or more mucosal breaks greater than 5 mm, not extending       between the tops of two mucosal folds) esophagitis with no bleeding was       found in the distal esophagus. Biopsies were taken with a cold forceps       for histology. Estimated blood loss was minimal.      The exam of the esophagus was otherwise normal.      Patchy mild inflammation characterized by congestion (edema) and       erosions was found in the gastric antrum.      The cardia and gastric fundus were normal on retroflexion.      The exam of the stomach was otherwise normal.      The examined duodenum was normal. Impression:            - Normal mucosa was found in the upper third of the                         esophagus and in the middle third of the esophagus.                        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                         Biopsied.                        - Gastritis.                        - Normal examined duodenum.                        - Biopsies performed in the upper third of the                         esophagus. Recommendation:        -  Patient has a contact number available for                         emergencies. The signs and symptoms of potential                         delayed complications were discussed with the patient.                         Return to normal activities tomorrow. Written                         discharge instructions were provided to the patient.                        - Resume previous diet.                        -  Continue present medications.                        - Use Protonix (pantoprazole) 40 mg PO daily.                        - Await pathology results.                        - Return to my office in 3 months.                        - The findings and recommendations were discussed with                         the patient. Procedure Code(s):     --- Professional ---                        [phone], Esophagogastroduodenoscopy, flexible,                         transoral; with biopsy, single or multiple Diagnosis Code(s):     --- Professional ---                        R07.89, Other chest pain                        R12, Heartburn                        K29.70, Gastritis, unspecified, without bleeding                        K21.00, Gastro-esophageal reflux disease with                         esophagitis, without bleeding CPT copyright 2022 American Medical Association. All rights reserved. The codes documented in this report are preliminary and upon coder review may  be revised to meet current compliance requirements. Stanton Kidney MD, MD 12/15/2023 9:44:15 AM This report has been signed electronically. Number of Addenda: 0 Note Initiated On: 12/15/2023 9:18 AM Estimated Blood Loss:  Estimated blood  loss was minimal.      [facility]
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
## 2023-12-16 ENCOUNTER — Encounter: Payer: Self-pay | Admitting: Internal Medicine

## 2023-12-16 LAB — SURGICAL PATHOLOGY

## 2024-02-29 ENCOUNTER — Encounter: Payer: Self-pay | Admitting: Internal Medicine

## 2024-03-01 ENCOUNTER — Encounter
Admission: RE | Disposition: A | Discharge status: Home / Self Care | Payer: Self-pay | Source: Home / Self Care | Attending: Internal Medicine

## 2024-03-01 ENCOUNTER — Ambulatory Visit
Admission: RE | Admit: 2024-03-01 | Discharge: 2024-03-01 | Disposition: A | Discharge status: Home / Self Care | Attending: Internal Medicine | Admitting: Internal Medicine

## 2024-03-01 ENCOUNTER — Ambulatory Visit: Admitting: Certified Registered"

## 2024-03-01 ENCOUNTER — Encounter: Payer: Self-pay | Admitting: Internal Medicine

## 2024-03-01 ENCOUNTER — Other Ambulatory Visit: Payer: Self-pay

## 2024-03-01 DIAGNOSIS — E119 Type 2 diabetes mellitus without complications: Secondary | ICD-10-CM | POA: Insufficient documentation

## 2024-03-01 DIAGNOSIS — D124 Benign neoplasm of descending colon: Secondary | ICD-10-CM | POA: Insufficient documentation

## 2024-03-01 DIAGNOSIS — Z87891 Personal history of nicotine dependence: Secondary | ICD-10-CM | POA: Diagnosis not present

## 2024-03-01 DIAGNOSIS — I252 Old myocardial infarction: Secondary | ICD-10-CM | POA: Insufficient documentation

## 2024-03-01 DIAGNOSIS — I2510 Atherosclerotic heart disease of native coronary artery without angina pectoris: Secondary | ICD-10-CM | POA: Diagnosis not present

## 2024-03-01 DIAGNOSIS — I10 Essential (primary) hypertension: Secondary | ICD-10-CM | POA: Diagnosis not present

## 2024-03-01 DIAGNOSIS — D125 Benign neoplasm of sigmoid colon: Secondary | ICD-10-CM | POA: Diagnosis not present

## 2024-03-01 DIAGNOSIS — K5730 Diverticulosis of large intestine without perforation or abscess without bleeding: Secondary | ICD-10-CM | POA: Diagnosis not present

## 2024-03-01 DIAGNOSIS — Z1211 Encounter for screening for malignant neoplasm of colon: Secondary | ICD-10-CM | POA: Insufficient documentation

## 2024-03-01 HISTORY — DX: Acute myocardial infarction, unspecified: I21.9

## 2024-03-01 LAB — GLUCOSE, CAPILLARY: Glucose-Capillary: 126 mg/dL — ABNORMAL HIGH (ref 70–99)

## 2024-03-01 SURGERY — COLONOSCOPY
Anesthesia: General

## 2024-03-01 MED ORDER — LIDOCAINE HCL (PF) 2 % IJ SOLN
INTRAMUSCULAR | Status: DC | PRN
  Administered 2024-03-01: 60 mg via INTRADERMAL

## 2024-03-01 MED ORDER — PROPOFOL 10 MG/ML IV BOLUS
INTRAVENOUS | Status: DC | PRN
  Administered 2024-03-01: 70 mg via INTRAVENOUS

## 2024-03-01 MED ORDER — PROPOFOL 1000 MG/100ML IV EMUL
INTRAVENOUS | Status: AC
Start: 2024-03-01 — End: 2024-03-01
  Filled 2024-03-01: qty 100

## 2024-03-01 MED ORDER — SODIUM CHLORIDE 0.9 % IV SOLN
INTRAVENOUS | Status: DC
Start: 2024-03-01 — End: 2024-03-01

## 2024-03-01 MED ORDER — PROPOFOL 500 MG/50ML IV EMUL
INTRAVENOUS | Status: DC | PRN
  Administered 2024-03-01: 100 ug/kg/min via INTRAVENOUS

## 2024-03-01 NOTE — Op Note (Signed)
 [facility] Patient Name: Blanton Kardell Procedure Date: 03/01/2024 9:34 AM MRN: [id number] Account #: [id number] Date of Birth: Jul 16, 1946 Admit Type: Outpatient Age: 78 Room: [facility] ENDO ROOM 1 Gender: Male Note Status: Finalized Instrument Name: Arvis [id number] Procedure:             Colonoscopy Indications:           Screening for colorectal malignant neoplasm Providers:             Axel Meas K. Aundria MD, MD Referring MD:          Alm HERO. Glover, MD (Referring MD) Medicines:             Propofol  per Anesthesia Complications:         No immediate complications. Estimated blood loss:                         Minimal. Procedure:             Pre-Anesthesia Assessment:                        - The risks and benefits of the procedure and the                         sedation options and risks were discussed with the                         patient. All questions were answered and informed                         consent was obtained.                        - Patient identification and proposed procedure were                         verified prior to the procedure by the nurse. The                         procedure was verified in the procedure room.                        - ASA Grade Assessment: III - A patient with severe                         systemic disease.                        - After reviewing the risks and benefits, the patient                         was deemed in satisfactory condition to undergo the                         procedure.                        After obtaining informed consent, the colonoscope was                         passed under direct  vision. Throughout the procedure,                         the patient's blood pressure, pulse, and oxygen                         saturations were monitored continuously. The                         Colonoscope was introduced through the anus and                         advanced to  the the cecum, identified by appendiceal                         orifice and ileocecal valve. The colonoscopy was                         performed without difficulty. The patient tolerated                         the procedure well. The quality of the bowel                         preparation was excellent. The ileocecal valve,                         appendiceal orifice, and rectum were photographed. Findings:      The perianal and digital rectal examinations were normal. Pertinent       negatives include normal sphincter tone and no palpable rectal lesions.      A few small-mouthed diverticula were found in the sigmoid colon.      A 4 mm polyp was found in the sigmoid colon. The polyp was sessile. The       polyp was removed with a jumbo cold forceps. Resection and retrieval       were complete. Estimated blood loss was minimal.      A 10 mm polyp was found in the descending colon. The polyp was sessile.       The polyp was removed with a cold snare. Resection and retrieval were       complete. Estimated blood loss was minimal.      The exam was otherwise without abnormality on direct and retroflexion       views. Impression:            - Diverticulosis in the sigmoid colon.                        - One 4 mm polyp in the sigmoid colon, removed with a                         jumbo cold forceps. Resected and retrieved.                        - One 10 mm polyp in the descending colon, removed                         with a cold snare. Resected and retrieved.                        -  The examination was otherwise normal on direct and                         retroflexion views. Recommendation:        - Patient has a contact number available for                         emergencies. The signs and symptoms of potential                         delayed complications were discussed with the patient.                         Return to normal activities tomorrow. Written                          discharge instructions were provided to the patient.                        - Resume previous diet.                        - Continue present medications.                        - If polyps are benign or adenomatous without                         dysplasia, I will advise NO further colonoscopy due to                         advanced age and/or severe comorbidity.                        - Return to GI office PRN.                        - The findings and recommendations were discussed with                         the patient. Procedure Code(s):     --- Professional ---                        [phone], Colonoscopy, flexible; with removal of                         tumor(s), polyp(s), or other lesion(s) by snare                         technique                        45380, 59, Colonoscopy, flexible; with biopsy, single                         or multiple Diagnosis Code(s):     --- Professional ---                        K57.30, Diverticulosis of large intestine without  perforation or abscess without bleeding                        D12.4, Benign neoplasm of descending colon                        D12.5, Benign neoplasm of sigmoid colon                        Z12.11, Encounter for screening for malignant neoplasm                         of colon CPT copyright 2022 American [employer]. All rights reserved. The codes documented in this report are preliminary and upon coder review may  be revised to meet current compliance requirements. Ladell MARLA Boss MD, MD 03/01/2024 10:03:05 AM This report has been signed electronically. Number of Addenda: 0 Note Initiated On: 03/01/2024 9:34 AM Scope Withdrawal Time: 0 hours 5 minutes 59 seconds  Total Procedure Duration: 0 hours 10 minutes 21 seconds  Estimated Blood Loss:  Estimated blood loss was minimal.      [facility]
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
## 2024-03-01 NOTE — Anesthesia Postprocedure Evaluation (Signed)
 Anesthesia Post Note  Patient: Jacob Knox  Procedure(s) Performed: COLONOSCOPY POLYPECTOMY, INTESTINE  Patient location during evaluation: Endoscopy Anesthesia Type: General Level of consciousness: awake and alert Pain management: pain level controlled Vital Signs Assessment: post-procedure vital signs reviewed and stable Respiratory status: spontaneous breathing, nonlabored ventilation and respiratory function stable Cardiovascular status: blood pressure returned to baseline and stable Postop Assessment: no apparent nausea or vomiting Anesthetic complications: no   No notable events documented.   Last Vitals:  Vitals:   03/01/24 1010 03/01/24 1021  BP: (!) 97/56 (!) 93/56  Pulse: 71 71  Resp: (!) 28 18  Temp:    SpO2: 95% 96%    Last Pain:  Vitals:   03/01/24 1021  TempSrc:   PainSc: 0-No pain                 [name]

## 2024-03-01 NOTE — Transfer of Care (Signed)
 Immediate Anesthesia Transfer of Care Note  Patient: Jacob Knox  Procedure(s) Performed: COLONOSCOPY POLYPECTOMY, INTESTINE  Patient Location: PACU  Anesthesia Type:General  Level of Consciousness: sedated  Airway & Oxygen Therapy: Patient Spontanous Breathing  Post-op Assessment: Report given to RN and Post -op Vital signs reviewed and stable  Post vital signs: Reviewed and stable  Last Vitals: Nl temp  See flow sheet  Vitals Value Taken Time  BP 94/58 03/01/24 10:01  Temp    Pulse 70 03/01/24 10:01  Resp 32 03/01/24 10:01  SpO2 99 % 03/01/24 10:01  Vitals shown include unfiled device data.  Last Pain:  Vitals:   03/01/24 0827  TempSrc: Temporal  PainSc: 0-No pain         Complications: No notable events documented.

## 2024-03-01 NOTE — Interval H&P Note (Signed)
 History and Physical Interval Note:  03/01/2024 9:40 AM  Jacob Knox  has presented today for surgery, with the diagnosis of Colon cancer screening (Z12.11).  The various methods of treatment have been discussed with the patient and family. After consideration of risks, benefits and other options for treatment, the patient has consented to  Procedure(s) with comments: COLONOSCOPY (N/A) - DM as a surgical intervention.  The patient's history has been reviewed, patient examined, no change in status, stable for surgery.  I have reviewed the patient's chart and labs.  Questions were answered to the patient's satisfaction.     Cane Beds, Zain Lankford
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
## 2024-03-02 ENCOUNTER — Encounter: Payer: Self-pay | Admitting: Internal Medicine

## 2024-03-02 LAB — SURGICAL PATHOLOGY

## 2024-03-17 ENCOUNTER — Ambulatory Visit (INDEPENDENT_AMBULATORY_CARE_PROVIDER_SITE_OTHER): Payer: Medicare Other | Admitting: Dermatology

## 2024-03-17 ENCOUNTER — Encounter: Payer: Self-pay | Admitting: Dermatology

## 2024-03-17 DIAGNOSIS — L814 Other melanin hyperpigmentation: Secondary | ICD-10-CM

## 2024-03-17 DIAGNOSIS — L821 Other seborrheic keratosis: Secondary | ICD-10-CM

## 2024-03-17 DIAGNOSIS — L578 Other skin changes due to chronic exposure to nonionizing radiation: Secondary | ICD-10-CM | POA: Diagnosis not present

## 2024-03-17 DIAGNOSIS — Z7189 Other specified counseling: Secondary | ICD-10-CM

## 2024-03-17 DIAGNOSIS — T07XXXA Unspecified multiple injuries, initial encounter: Secondary | ICD-10-CM

## 2024-03-17 DIAGNOSIS — Z1283 Encounter for screening for malignant neoplasm of skin: Secondary | ICD-10-CM

## 2024-03-17 DIAGNOSIS — D1801 Hemangioma of skin and subcutaneous tissue: Secondary | ICD-10-CM

## 2024-03-17 DIAGNOSIS — B351 Tinea unguium: Secondary | ICD-10-CM

## 2024-03-17 DIAGNOSIS — Z79899 Other long term (current) drug therapy: Secondary | ICD-10-CM

## 2024-03-17 DIAGNOSIS — S70312A Abrasion, left thigh, initial encounter: Secondary | ICD-10-CM | POA: Diagnosis not present

## 2024-03-17 DIAGNOSIS — B353 Tinea pedis: Secondary | ICD-10-CM

## 2024-03-17 DIAGNOSIS — W908XXA Exposure to other nonionizing radiation, initial encounter: Secondary | ICD-10-CM

## 2024-03-17 DIAGNOSIS — D229 Melanocytic nevi, unspecified: Secondary | ICD-10-CM

## 2024-03-17 MED ORDER — TERBINAFINE HCL 250 MG PO TABS: 250.0000 mg | ORAL_TABLET | Freq: Every day | ORAL | 0 refills | Status: DC

## 2024-03-17 NOTE — Progress Notes (Signed)
 New Patient Visit   Subjective  Jacob Knox is a 78 y.o. male who presents for the following: Skin Cancer Screening and Full Body Skin Exam  The patient presents for Total-Body Skin Exam (TBSE) for skin cancer screening and mole check. The patient has spots, moles and lesions to be evaluated, some may be new or changing and the patient may have concern these could be cancer.  The following portions of the chart were reviewed this encounter and updated as appropriate: medications, allergies, medical history  Review of Systems:  No other skin or systemic complaints except as noted in HPI or Assessment and Plan.  Objective  Well appearing patient in no apparent distress; mood and affect are within normal limits.  A full examination was performed including scalp, head, eyes, ears, nose, lips, neck, chest, axillae, abdomen, back, buttocks, bilateral upper extremities, bilateral lower extremities, hands, feet, fingers, toes, fingernails, and toenails. All findings within normal limits unless otherwise noted below.   Relevant physical exam findings are noted in the Assessment and Plan. L thigh Several 3 to 6 mm crusted patches left lateral thigh  Assessment & Plan   SKIN CANCER SCREENING PERFORMED TODAY.  ACTINIC DAMAGE - Chronic condition, secondary to cumulative UV/sun exposure - diffuse scaly erythematous macules with underlying dyspigmentation - Recommend daily broad spectrum sunscreen SPF 30+ to sun-exposed areas, reapply every 2 hours as needed.  - Staying in the shade or wearing long sleeves, sun glasses (UVA+UVB protection) and wide brim hats (4-inch brim around the entire circumference of the hat) are also recommended for sun protection.  - Call for new or changing lesions.  LENTIGINES, SEBORRHEIC KERATOSES, HEMANGIOMAS - Benign normal skin lesions - Benign-appearing - Call for any changes  MELANOCYTIC NEVI - Tan-brown and/or pink-flesh-colored symmetric macules and  papules - Benign appearing on exam today - Observation - Call clinic for new or changing moles - Recommend daily use of broad spectrum spf 30+ sunscreen to sun-exposed areas.   TINEA PEDIS with tinea unguium  - reviewed labs from 01/25/2024 WNL Exam: Scaling and maceration web spaces and over distal and lateral soles. Toenail dystrophy. Treatment Plan: Start Terbinafine  250 mg po QD.  Terbinafine  Counseling Terbinafine  is an anti-fungal medicine that can be applied to the skin (over the counter) or taken by mouth (prescription) to treat fungal infections. The pill version is often used to treat fungal infections of the nails or scalp. While most people do not have any side effects from taking terbinafine  pills, some possible side effects of the medicine can include taste changes, headache, loss of smell, vision changes, nausea, vomiting, or diarrhea.   Rare side effects can include irritation of the liver, allergic reaction, or decrease in blood counts (which may show up as not feeling well or developing an infection). If you are concerned about any of these side effects, please stop the medicine and call your doctor, or in the case of an emergency such as feeling very unwell, seek immediate medical care.   Patient to contact the office via MyChart to report if any side effects. If no side effects will send in 2 refills for a total of 3 months of treatment.  Advised patient that toenails will take up to 12 months to grow out. Long term medication management.  Patient is using long term (months to years) prescription medication  to control their dermatologic condition.  These medications require periodic monitoring to evaluate for efficacy and side effects and may require periodic laboratory monitoring.  MULTIPLE EXCORIATIONS L thigh Patient defers treatment, would like refill of topical ointment he has used in the past that seemed to help, but he is unsure of name. He will send a MyChart  message with name of prescription. SKIN CANCER SCREENING   SEBORRHEIC KERATOSIS   ACTINIC SKIN DAMAGE   LENTIGO   MELANOCYTIC NEVUS, UNSPECIFIED LOCATION   TINEA PEDIS OF BOTH FEET   TINEA UNGUIUM   COUNSELING AND COORDINATION OF CARE   MEDICATION MANAGEMENT   LONG-TERM USE OF HIGH-RISK MEDICATION    Return in about 6 months (around 09/17/2024) for tinea pedis and ungium follow up.  LILLETTE Rosina Mayans, CMA, am acting as scribe for Alm Rhyme, MD .  Documentation: I have reviewed the above documentation for accuracy and completeness, and I agree with the above.  Alm Rhyme, MD

## 2024-03-17 NOTE — Patient Instructions (Signed)

## 2024-03-21 ENCOUNTER — Telehealth: Payer: Self-pay

## 2024-03-21 MED ORDER — MOMETASONE FUROATE 0.1 % EX CREA: 1.0000 | TOPICAL_CREAM | CUTANEOUS | 1 refills | Status: DC

## 2024-03-21 NOTE — Telephone Encounter (Signed)
 Patient came by the office to let you know the previous prescription he had for the left thigh was Ultravate lotion. This was discussed at his appointment last Thursday and he was to follow up with you.

## 2024-03-21 NOTE — Telephone Encounter (Signed)
Patient advised and RX sent in. aw 

## 2024-04-12 ENCOUNTER — Telehealth: Payer: Self-pay

## 2024-04-12 MED ORDER — TERBINAFINE HCL 250 MG PO TABS: 250.0000 mg | ORAL_TABLET | Freq: Every day | ORAL | 1 refills | Status: AC

## 2024-04-12 NOTE — Telephone Encounter (Signed)
 Patient called reporting no side effects from Terbinafine . Two additional months sent in. aw

## 2024-05-14 ENCOUNTER — Other Ambulatory Visit: Payer: Self-pay | Admitting: Dermatology

## 2024-05-17 ENCOUNTER — Other Ambulatory Visit: Payer: Self-pay

## 2024-05-17 MED ORDER — MOMETASONE FUROATE 0.1 % EX CREA: TOPICAL_CREAM | CUTANEOUS | 0 refills | Status: AC

## 2024-05-17 NOTE — Progress Notes (Signed)
 Rx sent to [facility] for Mometasone  cream with correct instructions   Apply to area of thigh qd-bid prn

## 2024-05-26 ENCOUNTER — Other Ambulatory Visit: Payer: Self-pay | Admitting: *Deleted

## 2024-05-26 DIAGNOSIS — N200 Calculus of kidney: Secondary | ICD-10-CM

## 2024-06-03 ENCOUNTER — Ambulatory Visit (INDEPENDENT_AMBULATORY_CARE_PROVIDER_SITE_OTHER): Payer: Self-pay | Admitting: Urology

## 2024-06-03 ENCOUNTER — Encounter: Payer: Self-pay | Admitting: Urology

## 2024-06-03 ENCOUNTER — Ambulatory Visit
Admission: RE | Admit: 2024-06-03 | Discharge: 2024-06-03 | Disposition: A | Discharge status: Home / Self Care | Source: Ambulatory Visit | Attending: Urology | Admitting: Urology

## 2024-06-03 ENCOUNTER — Ambulatory Visit
Admission: RE | Admit: 2024-06-03 | Discharge: 2024-06-03 | Disposition: A | Discharge status: Home / Self Care | Attending: Urology | Admitting: Urology

## 2024-06-03 VITALS — BP 117/69 | HR 96 | Ht 69.0 in | Wt 158.0 lb

## 2024-06-03 DIAGNOSIS — N200 Calculus of kidney: Secondary | ICD-10-CM

## 2024-06-03 DIAGNOSIS — N401 Enlarged prostate with lower urinary tract symptoms: Secondary | ICD-10-CM | POA: Diagnosis not present
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
## 2024-06-09 ENCOUNTER — Telehealth: Payer: Self-pay

## 2024-06-09 NOTE — Telephone Encounter (Signed)
 Patient called to discuss his medication.   LM on VM returning his call please call back

## 2024-06-09 NOTE — Telephone Encounter (Signed)
 Patient called he is on his 3rd month  of Lamisil  tablets, what should he do now, no change in feet or toenails,  Discussed with patient he should finish the tablets he has then stop, it may take several months to notice a change in feet and nails, keep his follow up appointment 10/03/2024

## 2024-07-07 ENCOUNTER — Encounter: Payer: Self-pay | Admitting: Urology

## 2024-07-07 ENCOUNTER — Telehealth: Payer: Self-pay | Admitting: Urology

## 2024-07-07 ENCOUNTER — Ambulatory Visit
Admission: RE | Admit: 2024-07-07 | Discharge: 2024-07-07 | Disposition: A | Discharge status: Home / Self Care | Source: Ambulatory Visit | Attending: Urology | Admitting: Urology

## 2024-07-07 ENCOUNTER — Ambulatory Visit: Admitting: Urology

## 2024-07-07 VITALS — BP 126/76 | HR 73 | Temp 98.1°F | Ht 69.0 in | Wt 158.0 lb

## 2024-07-07 DIAGNOSIS — R109 Unspecified abdominal pain: Secondary | ICD-10-CM | POA: Diagnosis present

## 2024-07-07 DIAGNOSIS — Z87442 Personal history of urinary calculi: Secondary | ICD-10-CM | POA: Insufficient documentation

## 2024-07-07 LAB — URINALYSIS, COMPLETE
Bilirubin, UA: NEGATIVE
Glucose, UA: NEGATIVE
Ketones, UA: NEGATIVE
Leukocytes,UA: NEGATIVE
Nitrite, UA: NEGATIVE
RBC, UA: NEGATIVE
Specific Gravity, UA: 1.03 (ref 1.005–1.030)
Urobilinogen, Ur: 0.2 mg/dL (ref 0.2–1.0)
pH, UA: 6 (ref 5.0–7.5)

## 2024-07-07 LAB — MICROSCOPIC EXAMINATION
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
## 2024-07-07 NOTE — Telephone Encounter (Signed)
 Would you let Jacob Knox know that his Xray did not show a stone, but I recommend to have a CT for further evaluation of his pain.  Call [phone]

## 2024-07-08 NOTE — Telephone Encounter (Signed)
 Called patient and patient understood. Before the CT orders get put in he wants shannon to talk it over with [facility]

## 2024-07-09 ENCOUNTER — Ambulatory Visit: Payer: Self-pay | Admitting: Urology

## 2024-10-03 ENCOUNTER — Ambulatory Visit: Admitting: Dermatology

## 2024-10-11 ENCOUNTER — Ambulatory Visit: Admitting: Dermatology

## 2024-10-11 ENCOUNTER — Encounter: Payer: Self-pay | Admitting: Dermatology

## 2024-10-11 DIAGNOSIS — L308 Other specified dermatitis: Secondary | ICD-10-CM

## 2024-10-11 DIAGNOSIS — L821 Other seborrheic keratosis: Secondary | ICD-10-CM

## 2024-10-11 DIAGNOSIS — Z79899 Other long term (current) drug therapy: Secondary | ICD-10-CM

## 2024-10-11 DIAGNOSIS — L603 Nail dystrophy: Secondary | ICD-10-CM

## 2024-10-11 DIAGNOSIS — L2089 Other atopic dermatitis: Secondary | ICD-10-CM

## 2024-10-11 DIAGNOSIS — Z7189 Other specified counseling: Secondary | ICD-10-CM

## 2024-10-11 DIAGNOSIS — B351 Tinea unguium: Secondary | ICD-10-CM | POA: Diagnosis not present

## 2024-10-11 DIAGNOSIS — B353 Tinea pedis: Secondary | ICD-10-CM | POA: Diagnosis not present

## 2024-10-11 DIAGNOSIS — L309 Dermatitis, unspecified: Secondary | ICD-10-CM

## 2024-10-11 MED ORDER — TACROLIMUS 0.1 % EX OINT: TOPICAL_OINTMENT | CUTANEOUS | 11 refills | Status: AC

## 2024-10-11 NOTE — Progress Notes (Signed)
" ° °  Follow-Up Visit   Subjective  Jacob Knox is a 79 y.o. male who presents for the following:  Follow up on tinea pedis and unguium Has used terbinafine  for toenails reports no side effects   Also reports some dryness on finger tips   The following portions of the chart were reviewed this encounter and updated as appropriate: medications, allergies, medical history  Review of Systems:  No other skin or systemic complaints except as noted in HPI or Assessment and Plan.  Objective  Well appearing patient in no apparent distress; mood and affect are within normal limits.  A focused examination was performed of the following areas: B/l hands, b/l feet   Relevant exam findings are noted in the Assessment and Plan.  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 Condition is bothersome/symptomatic for patient. Currently flared. Hand Dermatitis is a chronic type of eczema that can come and go on the hands and fingers.  While there is no cure, the rash and symptoms can be managed with topical prescription medications, and for more severe cases, with systemic medications.  Recommend mild soap and routine use of moisturizing cream after handwashing.  Minimize soap/water  exposure when possible.   Treatment Plan Start Tacrolimus  ointment - apply ointment to affected areas bid   Recommend mild soap and moisturizing cream with hand washing.   TINEA PEDIS with tinea unguium   Resolved after 3 months of oral Terbinafine  treatment  Exam: clear at exam except for 2nd toenail dystrophy - see above diagnosis Chronic condition with duration or expected duration over one year. Currently well-controlled. Treatment Plan: No further recommended treatment    SEBORRHEIC KERATOSIS Face - Stuck-on, waxy, tan-brown papules and/or plaques  - Benign-appearing - Discussed benign etiology and prognosis. - Observe - Call for any changes  HAND DERMATITIS   This Visit - tacrolimus  (PROTOPIC ) 0.1 % ointment - Apply topically twice daily to hands and feet for eczema  Return in about 1 year (around 10/11/2025) for hand dermatitis .  IEleanor Blush, CMA, am acting as scribe for Alm Rhyme, MD.   Documentation: I have reviewed the above documentation for accuracy and completeness, and I agree with the above.  Alm Rhyme, MD    "

## 2024-10-11 NOTE — Patient Instructions (Signed)

## 2025-06-02 ENCOUNTER — Ambulatory Visit: Admitting: Urology

## 2025-10-11 ENCOUNTER — Ambulatory Visit: Admitting: Dermatology
